# Patient Record
Sex: Male | Born: 1980
Health system: Southern US, Community
[De-identification: ages and names within clinical notes are randomized; demographics above are authoritative.]

## PROBLEM LIST (undated history)

## (undated) DIAGNOSIS — I10 Essential (primary) hypertension: Secondary | ICD-10-CM

## (undated) DIAGNOSIS — T7840XA Allergy, unspecified, initial encounter: Secondary | ICD-10-CM

## (undated) DIAGNOSIS — F329 Major depressive disorder, single episode, unspecified: Secondary | ICD-10-CM

## (undated) DIAGNOSIS — K219 Gastro-esophageal reflux disease without esophagitis: Secondary | ICD-10-CM

## (undated) DIAGNOSIS — E669 Obesity, unspecified: Secondary | ICD-10-CM

## (undated) DIAGNOSIS — R011 Cardiac murmur, unspecified: Secondary | ICD-10-CM

## (undated) DIAGNOSIS — E785 Hyperlipidemia, unspecified: Secondary | ICD-10-CM

## (undated) DIAGNOSIS — F419 Anxiety disorder, unspecified: Secondary | ICD-10-CM

## (undated) DIAGNOSIS — K922 Gastrointestinal hemorrhage, unspecified: Secondary | ICD-10-CM

## (undated) DIAGNOSIS — J45909 Unspecified asthma, uncomplicated: Secondary | ICD-10-CM

## (undated) HISTORY — DX: Major depressive disorder, single episode, unspecified: F32.9

## (undated) HISTORY — DX: Cardiac murmur, unspecified: R01.1

## (undated) HISTORY — PX: OTHER SURGICAL HISTORY: SHX169

## (undated) HISTORY — DX: Anxiety disorder, unspecified: F41.9

## (undated) HISTORY — DX: Gastro-esophageal reflux disease without esophagitis: K21.9

## (undated) HISTORY — DX: Obesity, unspecified: E66.9

## (undated) HISTORY — DX: Hyperlipidemia, unspecified: E78.5

## (undated) HISTORY — DX: Gastrointestinal hemorrhage, unspecified: K92.2

## (undated) HISTORY — DX: Unspecified asthma, uncomplicated: J45.909

## (undated) HISTORY — DX: Allergy, unspecified, initial encounter: T78.40XA

## (undated) HISTORY — DX: Essential (primary) hypertension: I10

---

## 2017-06-30 DIAGNOSIS — K648 Other hemorrhoids: Secondary | ICD-10-CM | POA: Insufficient documentation

## 2018-02-17 ENCOUNTER — Encounter: Payer: Self-pay | Admitting: Gastroenterology

## 2018-03-29 ENCOUNTER — Ambulatory Visit (INDEPENDENT_AMBULATORY_CARE_PROVIDER_SITE_OTHER): Payer: PRIVATE HEALTH INSURANCE | Admitting: Gastroenterology

## 2018-03-29 ENCOUNTER — Encounter: Payer: Self-pay | Admitting: Gastroenterology

## 2018-03-29 VITALS — BP 122/82 | HR 70 | Ht 71.0 in | Wt 310.2 lb

## 2018-03-29 DIAGNOSIS — K6289 Other specified diseases of anus and rectum: Secondary | ICD-10-CM

## 2018-03-29 DIAGNOSIS — G8929 Other chronic pain: Secondary | ICD-10-CM | POA: Diagnosis not present

## 2018-03-29 MED ORDER — HYDROCORTISONE ACE-PRAMOXINE 1.85-1.15 % RE CREA: 1.0000 "application " | TOPICAL_CREAM | Freq: Two times a day (BID) | RECTAL | 1 refills | Status: DC

## 2018-03-29 NOTE — Patient Instructions (Signed)
If you are age 35 or older, your body mass index should be between 23-30. Your Body mass index is 43.27 kg/m. If this is out of the aforementioned range listed, please consider follow up with your Primary Care Provider.  If you are age 64 or younger, your body mass index should be between 19-25. Your Body mass index is 43.27 kg/m. If this is out of the aformentioned range listed, please consider follow up with your Primary Care Provider.   We have sent the following medications to your pharmacy for you to pick up at your convenience: Procort twice daily x 2 weeks.   How to Take a Sitz Bath A sitz bath is a warm water bath that is taken while you are sitting down. The water should only come up to your hips and should cover your buttocks. Your health care provider may recommend a sitz bath to help you:  Clean the lower part of your body, including your genital area.  With itching.  With pain.  With sore muscles or muscles that tighten or spasm.  How to take a sitz bath Take 3-4 sitz baths per day or as told by your health care provider. 1. Partially fill a bathtub with warm water. You will only need the water to be deep enough to cover your hips and buttocks when you are sitting in it. 2. If your health care provider told you to put medicine in the water, follow the directions exactly. 3. Sit in the water and open the tub drain a little. 4. Turn on the warm water again to keep the tub at the correct level. Keep the water running constantly. 5. Soak in the water for 15-20 minutes or as told by your health care provider. 6. After the sitz bath, pat the affected area dry first. Do not rub it. 7. Be careful when you stand up after the sitz bath because you may feel dizzy.  Contact a health care provider if:  Your symptoms get worse. Do not continue with sitz baths if your symptoms get worse.  You have new symptoms. Do not continue with sitz baths until you talk with your health care  provider. This information is not intended to replace advice given to you by your health care provider. Make sure you discuss any questions you have with your health care provider. Document Released: 08/08/2004 Document Revised: 04/15/2016 Document Reviewed: 11/14/2014 Elsevier Interactive Patient Education  2018 [employer].   Thank you,  Dr. Jackquline Denmark

## 2018-03-29 NOTE — Progress Notes (Signed)
Chief Complaint: Rectal pain  Referring Provider: Dr. Reinaldo Meeker   ASSESSMENT AND PLAN;   Rectal pain (due to anal fissure and hoids) - Colace 1 tablet p.o. once a day to continue.  It has given him significant symptomatic relief. - procort bid x 14 days. Cards and coupons were given. - Call in 2 weeks. If still with problems, would recommend colonoscopy evaluation under propofol and a good rectal examination.  He would like to hold off on colonoscopy at the present time.  - Carry on sitz baths for now. -Good perirectal hygiene, can use unscented baby wipes, keep the perirectal area clean and dry. - FU in 12 weeks, earlier in case of any problems. - I have discussed above with the patient's wife in detail as well.  HPI:   Rectal pain x 6-7 yrs, intermittently BMs 5-6/day, after eating, mushy,  Occ constipation with hard stool with straining. Started stool sofner docusate sodium 1/day with significant relief. occ rare BRBPR. 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  Seen by surgery Dr Leanne Lovely and was scheduled for surgery but patient backed off. No fever chills night sweats, weight loss, family history of colon cancer or significant rectal drainage.  There is no family history of Crohn's disease. Never had EGD or colonoscopy performed. He does have history of lifting weights and has been lifting heavy furniture at work.  Past Medical History:  Diagnosis Date  . Anxiety   . Asthma   . Depression   . GERD (gastroesophageal reflux disease)   . GI bleed   . Hypertension   . Obesity     History reviewed. No pertinent surgical history.  Family History  Problem Relation Age of Onset  . Heart disease Father   . Diabetes Maternal Grandfather     Social History   Tobacco Use  . Smoking status: Former Smoker    Packs/day: 3.00    Years: 4.00    Pack years: 12.00    Last attempt to quit: 2003    Years since quitting: 16.3  . Smokeless tobacco: Current User    Types: Snuff    Substance Use Topics  . Alcohol use: Not Currently  . Drug use: Never    Current Outpatient Medications  Medication Sig Dispense Refill  . cetirizine (ZYRTEC) 10 MG tablet Take 10 mg by mouth daily.    Marland Kitchen docusate sodium (COLACE) 100 MG capsule Take 200 mg by mouth daily.     No current facility-administered medications for this visit.     Allergies  Allergen Reactions  . Codeine Rash  . Penicillins Rash    Review of Systems:  Constitutional: Denies fever, chills, diaphoresis, appetite change and fatigue.  HEENT: Denies photophobia, eye pain, redness, hearing loss, ear pain, congestion, sore throat, rhinorrhea, sneezing, mouth sores, neck pain, neck stiffness and tinnitus.   Respiratory: Denies SOB, DOE, cough, chest tightness,  and wheezing.   Cardiovascular: Denies chest pain, palpitations and leg swelling.  Genitourinary: Denies dysuria, urgency, frequency, hematuria, flank pain and difficulty urinating.  Musculoskeletal: Denies myalgias, back pain, joint swelling, arthralgias and gait problem.  Skin: No rash. .  Neurological: Denies dizziness, seizures, syncope, weakness, light-headedness, numbness and headaches.  Hematological: Denies adenopathy. Easy bruising, personal or family bleeding history  Psychiatric/Behavioral: Has history of anxiety or depression     Physical Exam:    BP 122/82   Pulse 70   Ht 5\' 11"  (1.803 m)   Wt (!) 310 lb 4 oz (  140.7 kg)   BMI 43.27 kg/m  Filed Weights   03/29/18 1051  Weight: (!) 310 lb 4 oz (140.7 kg)   Constitutional:  Well-developed, in no acute distress. Psychiatric: Normal mood and affect. Behavior is normal. HEENT: Pupils normal.  Conjunctivae are normal. No scleral icterus. Neck supple.  Cardiovascular: Normal rate, regular rhythm. No edema Pulmonary/chest: Effort normal and breath sounds normal. No wheezing, rales or rhonchi. Abdominal: Soft, nondistended. Nontender. Bowel sounds active throughout. There are no masses  palpable. No hepatomegaly. Rectal: Limited examination since patient was somewhat apprehensive, I could feel a posterior anal fissure but could not reach on the top of it.  Small left lateral anterior hemorrhoid without any tenderness.  Heme-negative stools.  Rectal examination was performed in presence of Heather. Neurological: Alert and oriented to person place and time. Skin: Skin is warm and dry. No rashes noted.  Data Reviewed: I have personally reviewed following labs and imaging studies  CBC: No results for input(s): WBC, NEUTROABS, HGB, HCT, MCV, PLT in the last 168 hours. Basic Metabolic Panel: No results for input(s): NA, K, CL, CO2, GLUCOSE, BUN, CREATININE, CALCIUM, MG, PHOS in the last 168 hours. GFR: CrCl cannot be calculated (No order found.). Liver Function Tests: No results for input(s): AST, ALT, ALKPHOS, BILITOT, PROT, ALBUMIN in the last 168 hours. No results for input(s): LIPASE, AMYLASE in the last 168 hours. No results for input(s): AMMONIA in the last 168 hours. Coagulation Profile: No results for input(s): INR, PROTIME in the last 168 hours. HbA1C: No results for input(s): HGBA1C in the last 72 hours. Lipid Profile: No results for input(s): CHOL, HDL, LDLCALC, TRIG, CHOLHDL, LDLDIRECT in the last 72 hours. Thyroid Function Tests: No results for input(s): TSH, T4TOTAL, FREET4, T3FREE, THYROIDAB in the last 72 hours. Anemia Panel: No results for input(s): VITAMINB12, FOLATE, FERRITIN, TIBC, IRON, RETICCTPCT in the last 72 hours.  No results found for this or any previous visit (from the past 240 hour(s)).    Radiology Studies: No results found.    Carmell Austria, MD   Cc: Dr. Reinaldo Meeker

## 2019-03-22 ENCOUNTER — Telehealth: Payer: Self-pay | Admitting: *Deleted

## 2019-03-22 ENCOUNTER — Other Ambulatory Visit: Payer: Self-pay

## 2019-03-22 ENCOUNTER — Telehealth (INDEPENDENT_AMBULATORY_CARE_PROVIDER_SITE_OTHER): Payer: PRIVATE HEALTH INSURANCE | Admitting: Gastroenterology

## 2019-03-22 ENCOUNTER — Encounter: Payer: Self-pay | Admitting: Gastroenterology

## 2019-03-22 VITALS — Ht 71.0 in | Wt 306.0 lb

## 2019-03-22 DIAGNOSIS — K6289 Other specified diseases of anus and rectum: Secondary | ICD-10-CM

## 2019-03-22 DIAGNOSIS — K625 Hemorrhage of anus and rectum: Secondary | ICD-10-CM

## 2019-03-22 MED ORDER — NA SULFATE-K SULFATE-MG SULF 17.5-3.13-1.6 GM/177ML PO SOLN: 1.0000 | Freq: Once | ORAL | 0 refills | Status: AC

## 2019-03-22 MED ORDER — FLUTICASONE PROPIONATE 0.05 % EX CREA: TOPICAL_CREAM | Freq: Two times a day (BID) | CUTANEOUS | 1 refills | Status: DC

## 2019-03-22 NOTE — Patient Instructions (Addendum)
Increase Colace 3/day.   fluticasone cream 0.05% generic 30g 1 bid PR x 10 days, 1 refill. Prescription sent to your pharmacy   Proceed with colonoscopy evaluation  With 2 day prep (suprep/miralax) under propofol and a good rectal examination.    You have been scheduled for a colonoscopy. 04/03/2019 at 8am Please follow written instructions given to you at your visit today.  Please pick up your prep supplies at the pharmacy within the next 1-3 days. ( Instructions mailed today) Suprep sent to pharmacy and the Miralax is over the counter   Good perirectal hygiene . Carry on sitz baths for now.   How to Take a [employer] A sitz bath is a warm water bath that may be used to care for your rectum, genital area, or the area between your rectum and genitals (perineum). For a sitz bath, the water only comes up to your hips and covers your buttocks. A sitz bath may done at home in a bathtub or with a portable sitz bath that fits over the toilet. Your health care provider may recommend a sitz bath to help:  Relieve pain and discomfort after delivering a baby.  Relieve pain and itching from hemorrhoids or anal fissures.  Relieve pain after certain surgeries.  Relax muscles that are sore or tight. How to take a sitz bath Take 3-4 sitz baths a day, or as many as told by your health care provider. Bathtub sitz bath To take a sitz bath in a bathtub: 1. Partially fill a bathtub with warm water. The water should be deep enough to cover your hips and buttocks when you are sitting in the tub. 2. If your health care provider told you to put medicine in the water, follow his or her instructions. 3. Sit in the water. 4. Open the tub drain a little, and leave it open during your bath. 5. Turn on the warm water again, enough to replace the water that is draining out. Keep the water running throughout your bath. This helps keep the water at the right level and the right temperature. 6. Soak in the water for  15-20 minutes, or as long as told by your health care provider. 7. When you are done, be careful when you stand up. You may feel dizzy. 8. After the sitz bath, pat yourself dry. Do not rub your skin to dry it.  Over-the-toilet sitz bath To take a sitz bath with an over-the-toilet basin: 1. Follow the manufacturer's instructions. 2. Fill the basin with warm water. 3. If your health care provider told you to put medicine in the water, follow his or her instructions. 4. Sit on the seat. Make sure the water covers your buttocks and perineum. 5. Soak in the water for 15-20 minutes, or as long as told by your health care provider. 6. After the sitz bath, pat yourself dry. Do not rub your skin to dry it. 7. Clean and dry the basin between uses. 8. Discard the basin if it cracks, or according to the manufacturer's instructions. Contact a health care provider if:  Your symptoms get worse. Do not continue with sitz baths if your symptoms get worse.  You have new symptoms. If this happens, do not continue with sitz baths until you talk with your health care provider. Summary  A sitz bath is a warm water bath in which the water only comes up to your hips and covers your buttocks.  A sitz bath may help relieve itching, relieve pain,  and relax muscles that are sore or tight in the lower part of your body, including your genital area.  Take 3-4 sitz baths a day, or as many as told by your health care provider. Soak in the water for 15-20 minutes.  Do not continue with sitz baths if your symptoms get worse. This information is not intended to replace advice given to you by your health care provider. Make sure you discuss any questions you have with your health care provider. Document Released: 08/08/2004 Document Revised: 11/18/2017 Document Reviewed: 11/18/2017 Elsevier Interactive Patient Education  2019 [place] for choosing [facility]  Dr Lyndel Safe

## 2019-03-22 NOTE — Progress Notes (Signed)
Chief Complaint: Rectal pain  Referring Provider: Dr. Lawrence Perry   ASSESSMENT AND PLAN;   #1. Rectal pain (due to anal fissure and hoids) #2. Rectal bleeding. D/d hoids, AVMs, colitis, polyps, stercoral ulcers etc, r/o colonic neoplasms or IBD.  Plan: - Increase Colace 3/day. - fluticasone cream 0.05% generic 30g 1 bid PR x 10 days, 1 refill. - Proceed with colonoscopy evaluation  With 2 day prep (suprep/miralax) under propofol and a good rectal examination.  Discussed risks and benefits.  He wishes to proceed. - Carry on sitz baths for now. - Good perirectal hygiene.  HPI:   Rectal pain x 6-7 yrs, intermittently BMs 5-6/day, after eating, mushy,  Occ constipation with hard stool with straining. Started stool sofner docusate sodium 2/day with some relief Still with problems Having more BRBPR -at times mixed with the stool. Has been using ProCort but still with problems   Seen by surgery Dr Andy Moorhead and was scheduled for surgery but patient backed off. No fever chills night sweats, weight loss, family history of colon cancer or significant rectal drainage.  There is no family history of Crohn's disease. Never had EGD or colonoscopy performed.    He does have history of lifting weights and has been lifting heavy furniture at work.  Past Medical History:  Diagnosis Date  . Anxiety   . Asthma   . Depression   . GERD (gastroesophageal reflux disease)   . GI bleed   . Hypertension   . Obesity     History reviewed. No pertinent surgical history.  Family History  Problem Relation Age of Onset  . Heart disease Father   . Diabetes Maternal Grandfather     Social History   Tobacco Use  . Smoking status: Former Smoker    Packs/day: 3.00    Years: 4.00    Pack years: 12.00    Last attempt to quit: 2003    Years since quitting: 17.3  . Smokeless tobacco: Current User    Types: Snuff  Substance Use Topics  . Alcohol use: Not Currently  . Drug use:  Never    Current Outpatient Medications  Medication Sig Dispense Refill  . cetirizine (ZYRTEC) 10 MG tablet Take 10 mg by mouth daily.    . docusate sodium (COLACE) 100 MG capsule Take 200 mg by mouth daily.    . Hydrocortisone Ace-Pramoxine (PROCORT) 1.85-1.15 % CREA Place 1 application rectally 2 (two) times daily. Please give 1 kit. 60 g 1   No current facility-administered medications for this visit.     Allergies  Allergen Reactions  . Codeine Rash  . Penicillins Rash    Review of Systems:  Constitutional: Denies fever, chills, diaphoresis, appetite change and fatigue.  neg     Physical Exam:    Ht 5' 11" (1.803 m)   Wt (!) 306 lb (138.8 kg)   BMI 42.68 kg/m  Filed Weights   03/22/19 0821  Weight: (!) 306 lb (138.8 kg)   This service was provided via telemedicine.  The patient was located at home.  The provider was located in office.  The patient did consent to this telephone visit and is aware of possible charges through their insurance for this visit.    Time spent on call and coordination of care: 25 min    Raj , MD   Cc: Dr. Lawrence Perry  

## 2019-03-22 NOTE — Telephone Encounter (Signed)
Patient chose 04/03/2019 at 8 am to proceed with his colonoscopy  Will mail out instructions today

## 2019-03-23 ENCOUNTER — Telehealth: Payer: Self-pay | Admitting: Gastroenterology

## 2019-03-23 NOTE — Telephone Encounter (Signed)
We will not be able to email a letter. I can fax it.   Instructed pt to call back with who the fax needs to be addressed to.  Pt would like to go back to work, however he still has rectal pain and bleeding. The pain is at a 8 or 9 during a BM. Stool are some what hard but the stool softener (colace) is helpful. Pt just started procort yesterday and it helps as well. Last bleeding episode was about 3-4 days ago.  Dr. Lyndel Safe, I suggested to pt to wait out the weekend. 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 Lots of physical work activity. Since you are out of the office today and it being Thursday may be the pt could start next Monday.  I informed pt that you will be in tomorrow and that will allow time for him to get fax number and for you to address this.

## 2019-03-23 NOTE — Telephone Encounter (Signed)
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 If it can be e-mail to: [email or web address]

## 2019-03-23 NOTE — Telephone Encounter (Signed)
Fax# [phone] attention to [facility].

## 2019-03-24 NOTE — Progress Notes (Signed)
Per Dr. Lyndel Safe pt may return to work 03/27/19. Work note faxed. Pt made aware.

## 2019-03-24 NOTE — Telephone Encounter (Signed)
Can go back to work on Monday Please send him a letter

## 2019-03-24 NOTE — Telephone Encounter (Signed)
Pt informed. Work note faxed.

## 2019-04-03 ENCOUNTER — Other Ambulatory Visit: Payer: Self-pay

## 2019-04-03 ENCOUNTER — Encounter: Payer: Self-pay | Admitting: Gastroenterology

## 2019-04-03 ENCOUNTER — Ambulatory Visit (AMBULATORY_SURGERY_CENTER): Payer: PRIVATE HEALTH INSURANCE | Admitting: Gastroenterology

## 2019-04-03 VITALS — BP 124/89 | HR 58 | Temp 98.3°F | Resp 17 | Ht 71.0 in | Wt 306.0 lb

## 2019-04-03 DIAGNOSIS — K6289 Other specified diseases of anus and rectum: Secondary | ICD-10-CM

## 2019-04-03 DIAGNOSIS — K621 Rectal polyp: Secondary | ICD-10-CM

## 2019-04-03 DIAGNOSIS — K648 Other hemorrhoids: Secondary | ICD-10-CM | POA: Diagnosis not present

## 2019-04-03 DIAGNOSIS — K625 Hemorrhage of anus and rectum: Secondary | ICD-10-CM

## 2019-04-03 DIAGNOSIS — D128 Benign neoplasm of rectum: Secondary | ICD-10-CM

## 2019-04-03 MED ORDER — SODIUM CHLORIDE 0.9 % IV SOLN: 500.0000 mL | Freq: Once | INTRAVENOUS | Status: DC

## 2019-04-03 NOTE — Progress Notes (Signed)
Called to room to assist during endoscopic procedure.  Patient ID and intended procedure confirmed with present staff. Received instructions for my participation in the procedure from the performing physician.  

## 2019-04-03 NOTE — Patient Instructions (Addendum)
Avoid non steroidal anti inflammatory drugs for five days, take tylenol if needed for pain. Continue Colace and Fluticasone.   YOU HAD AN ENDOSCOPIC PROCEDURE TODAY AT [facility]:   Refer to the procedure report that was given to you for any specific questions about what was found during the examination.  If the procedure report does not answer your questions, please call your gastroenterologist to clarify.  If you requested that your care partner not be given the details of your procedure findings, then the procedure report has been included in a sealed envelope for you to review at your convenience later.  YOU SHOULD EXPECT: Some feelings of bloating in the abdomen. Passage of more gas than usual.  Walking can help get rid of the air that was put into your GI tract during the procedure and reduce the bloating. If you had a lower endoscopy (such as a colonoscopy or flexible sigmoidoscopy) you may notice spotting of blood in your stool or on the toilet paper. If you underwent a bowel prep for your procedure, you may not have a normal bowel movement for a few days.  Please Note:  You might notice some irritation and congestion in your nose or some drainage.  This is from the oxygen used during your procedure.  There is no need for concern and it should clear up in a day or so.  SYMPTOMS TO REPORT IMMEDIATELY:   Following lower endoscopy (colonoscopy or flexible sigmoidoscopy):  Excessive amounts of blood in the stool  Significant tenderness or worsening of abdominal pains  Swelling of the abdomen that is new, acute  Fever of 100F or higher   For urgent or emergent issues, a gastroenterologist can be reached at any hour by calling [phone].   DIET:  We do recommend a small meal at first, but then you may proceed to your regular diet.  Drink plenty of fluids but you should avoid alcoholic beverages for 24 hours.  ACTIVITY:  You should plan to take it easy for the rest of  today and you should NOT DRIVE or use heavy machinery until tomorrow (because of the sedation medicines used during the test).    FOLLOW UP: Our staff will call the number listed on your records the next business day following your procedure to check on you and address any questions or concerns that you may have regarding the information given to you following your procedure. If we do not reach you, we will leave a message.  However, if you are feeling well and you are not experiencing any problems, there is no need to return our call.  We will assume that you have returned to your regular daily activities without incident.  If any biopsies were taken you will be contacted by phone or by letter within the next 1-3 weeks.  Please call us at [phone] if you have not heard about the biopsies in 3 weeks.    SIGNATURES/CONFIDENTIALITY: You and/or your care partner have signed paperwork which will be entered into your electronic medical record.  These signatures attest to the fact that that the information above on your After Visit Summary has been reviewed and is understood.  Full responsibility of the confidentiality of this discharge information lies with you and/or your care-partner.

## 2019-04-03 NOTE — Progress Notes (Signed)
Report given to PACU, vss 

## 2019-04-03 NOTE — Op Note (Signed)
[place] Patient Name: Mitchell Booker Procedure Date: 04/03/2019 7:59 AM MRN: [id number] Endoscopist: Jackquline Denmark , MD Age: 38 Referring MD:  Date of Birth: [birth year] Gender: Male Account #: [id number] Procedure:                Colonoscopy Indications:              Rectal bleeding Medicines:                Monitored Anesthesia Care Procedure:                Pre-Anesthesia Assessment:                           - Prior to the procedure, a History and Physical                            was performed, and patient medications and                            allergies were reviewed. The patient's tolerance of                            previous anesthesia was also reviewed. The risks                            and benefits of the procedure and the sedation                            options and risks were discussed with the patient.                            All questions were answered, and informed consent                            was obtained. Prior Anticoagulants: The patient has                            taken no previous anticoagulant or antiplatelet                            agents. ASA Grade Assessment: I - A normal, healthy                            patient. After reviewing the risks and benefits,                            the patient was deemed in satisfactory condition to                            undergo the procedure.                           After obtaining informed consent, the colonoscope  was passed under direct vision. Throughout the                            procedure, the patient's blood pressure, pulse, and                            oxygen saturations were monitored continuously. The                            Colonoscope was introduced through the anus and                            advanced to the 4 cm into the ileum. The                            colonoscopy was performed without difficulty. The        patient tolerated the procedure well. The quality                            of the bowel preparation was excellent. The                            terminal ileum, ileocecal valve, appendiceal                            orifice, and rectum were photographed. Scope In: 8:03:10 AM Scope Out: 8:13:47 AM Scope Withdrawal Time: 0 hours 8 minutes 50 seconds  Total Procedure Duration: 0 hours 10 minutes 37 seconds  Findings:                 Three sessile polyps were found in the rectum. The                            polyps were 6 to 10 mm in size. These polyps were                            removed with a hot snare. Resection and retrieval                            were complete. Estimated blood loss: none.                           Non-bleeding internal hemorrhoids were found during                            retroflexion. The hemorrhoids were small. Healed                            posterior anal fissure.                           The exam was otherwise without abnormality on  direct and retroflexion views.                           The terminal ileum appeared normal. Complications:            No immediate complications. Estimated Blood Loss:     Estimated blood loss: none. Impression:               -Rectal polyps status post polypectomy.                           -Small internal hemorrhoids.                           -Otherwise normal colonoscopy to TI. Recommendation:           - Patient has a contact number available for                            emergencies. The signs and symptoms of potential                            delayed complications were discussed with the                            patient. Return to normal activities tomorrow.                            Written discharge instructions were provided to the                            patient.                           - Resume previous diet.                           - Continue present  medications including Colace and                            Fluticasone prn.                           - Await pathology results.                           - Avoid nonsteroidals for 5 days.                           - Repeat colonoscopy for surveillance based on                            pathology results.                           - Return to GI tele-clinic in 12 weeks. Jackquline Denmark, MD 04/03/2019 8:20:16 AM This report has been signed electronically.

## 2019-04-04 ENCOUNTER — Telehealth: Payer: Self-pay

## 2019-04-04 NOTE — Telephone Encounter (Signed)
  Follow up Call-  Call back number 04/03/2019  Post procedure Call Back phone  # [phone]  Permission to leave phone message Yes     Patient questions:  Do you have a fever, pain , or abdominal swelling? No. Pain Score  0 *  Have you tolerated food without any problems? Yes.    Have you been able to return to your normal activities? Yes.    Do you have any questions about your discharge instructions: Diet   No. Medications  No. Follow up visit  No.  Do you have questions or concerns about your Care? No.  Actions: * If pain score is 4 or above:0

## 2019-04-04 NOTE — Telephone Encounter (Signed)
Left message on follow up call. 

## 2019-04-06 ENCOUNTER — Encounter: Payer: Self-pay | Admitting: Gastroenterology

## 2019-04-12 ENCOUNTER — Telehealth: Payer: Self-pay | Admitting: *Deleted

## 2019-04-12 NOTE — Telephone Encounter (Signed)
1. Have you developed a fever since your procedure? No.  2.   Have you had an respiratory symptoms (SOB or cough) since your procedure? No.  3.   Have you tested positive for COVID 19 since your procedure No.  3.   Have you had any family members/close contacts diagnosed with the COVID 19 since your procedure?  No   If any of these questions are a yes, please inquire if patient has been seen by family doctor and route this note to Jackie Littlejohn Walton, RN. 

## 2019-04-28 ENCOUNTER — Telehealth: Payer: Self-pay | Admitting: Gastroenterology

## 2019-04-28 NOTE — Telephone Encounter (Signed)
Pt called and states he is in as much pain as he was prior to having colonoscopy done. He states he is using hemorrhoid creams and took some stool softeners. He was just leaving urgent care when I called him. Asked pt what they did for him and he states they wrote him a script but he cannot read the writing but he thinks it is for some pain medication. Pt wants the pain gone. Let him know this RN would notify the doctor and let him know the recommendation. Please advise.

## 2019-04-28 NOTE — Telephone Encounter (Signed)
Urgent care must have done a rectal exam Not sure which cream the prescribed. He should use that and let us know He still has problems next week, will give him Cardizem or nitroglycerin cream.  If he still has problems, will consider further evaluation. Please tell him to call us next week to let us know how he is doing even if he is better. Thanks [employer]

## 2019-04-28 NOTE — Telephone Encounter (Signed)
Spoke with pt and he is aware. 

## 2019-04-28 NOTE — Telephone Encounter (Signed)
Pt called stating that he has been experiencing rectal pain today and is excrutiating. He states to have been feeling fine after his procedure on 04/03/19 but today for some reason he is in pain. He would like some advise.

## 2019-04-29 ENCOUNTER — Telehealth: Payer: Self-pay | Admitting: Internal Medicine

## 2019-04-29 NOTE — Telephone Encounter (Signed)
Chart reviewed. Wife called for patient with severe rectal pain after a hard BM. Classic for fissure. Recommended keeping bowel soft (mirilax etc) and using tramadol, given by urgent care, for pain. Also, sitz bath bid. Told to call office Monday if needed. May benefit from 2% diltiazem cream from specialty pharmacy.

## 2019-05-01 ENCOUNTER — Telehealth: Payer: Self-pay | Admitting: Gastroenterology

## 2019-05-01 ENCOUNTER — Other Ambulatory Visit: Payer: Self-pay

## 2019-05-01 MED ORDER — DILTIAZEM GEL 2 %: CUTANEOUS | 1 refills | Status: DC

## 2019-05-01 NOTE — Telephone Encounter (Signed)
Pt was seen in the ED 04/29/19.  Pt reported severe anal pain, possible anal fissure and requested a call to discuss.

## 2019-05-01 NOTE — Telephone Encounter (Signed)
Pt did not have a good weekend. He spoke with oncall md and was seen in the ER. Pt was instructed to keep his BM's soft and told to take miralax 1 capful BID and colace BID. Wife wants to know if this is the bowel regimen Dr. Lyndel Safe feels he needs to do to prevent this type of pain again. On call md also mentioned to them possibly adding diltiazem or nitro ointment. Please advise.

## 2019-05-01 NOTE — Telephone Encounter (Signed)
Spoke with pt and he is aware, script sent to pharmacy. 

## 2019-05-01 NOTE — Telephone Encounter (Signed)
Cardizem 0.2% ointment PR QID x 1 week, then BID [facility] pharmacy to make (I believe [facility] or [facility] in [place] can make it) 2 refills  Let us know how he is in 1 week.

## 2019-05-04 ENCOUNTER — Encounter: Payer: Self-pay | Admitting: Gastroenterology

## 2019-05-04 ENCOUNTER — Ambulatory Visit (INDEPENDENT_AMBULATORY_CARE_PROVIDER_SITE_OTHER): Payer: PRIVATE HEALTH INSURANCE | Admitting: Gastroenterology

## 2019-05-04 ENCOUNTER — Other Ambulatory Visit: Payer: Self-pay

## 2019-05-04 VITALS — Ht 71.0 in | Wt 300.0 lb

## 2019-05-04 DIAGNOSIS — K6289 Other specified diseases of anus and rectum: Secondary | ICD-10-CM

## 2019-05-04 NOTE — Progress Notes (Signed)
Chief Complaint: FU Rectal pain  Referring Provider: Dr. Reinaldo Meeker   ASSESSMENT AND PLAN;   #1. Rectal pain (due to anal fissure). Neg colonoscopy 04/03/2019 except for small hyperplastic rectal polyps, small internal hemorrhoids.  Otherwise normal to TI.  No Crohn's. Neg CT pelvis with contrast 04/30/2019. Didn't fill topical Cardizem prescription.  Plan: -Colace 1 tablet p.o. twice daily to continue. -MiraLAX 17 g p.o. twice daily to continue. -Continue fluticasone cream 0.05% generic 30g 1 bid PR x 10 days, 1 refill. -Continue nitroglycerin 0.4% twice daily x 4 weeks. -Carry on sitz baths PRN for now. -Good perirectal hygiene, can use unscented baby wipes, keep the perirectal area clean and dry. -He is to call us if he has any problems in the future.  If he has recurrent problems, and fails medical treatment, would recommend surgical evaluation (Dr Coralie Keens) for possible EUA/lateral sphincterotomy if needed.  Certainly, that will be the last resort.  HPI:   38 year old very pleasant male  With intermittent rectal pain x 6-7 yrs.  Had some constipation which is currently better with stool softeners and MiraLAX. At the present time, denies having any problems.  Feels 100% better. He was at work.  Had negative colonoscopy 04/03/2019.  Was seen in ED on May 01, 2019 with acute rectal pain.  Had to be given Dilaudid.  He was thereafter started on nitroglycerin 0.4% twice daily and carried on with fluticasone.  Had mildly elevated WBC count 12 point 5K with normal hemoglobin.  Underwent CT scan pelvis with contrast which was negative for perianal fistula or abscess.  Seen by surgery Dr Leanne Lovely and was scheduled for surgery but patient backed off several months ago.  No fever chills night sweats, weight loss, family history of colon cancer or significant rectal drainage.  There is no family history of Crohn's disease.   He does have history of lifting weights and has been  lifting heavy furniture at work.  Past Medical History:  Diagnosis Date  . Allergy    seasonal  . Anxiety   . Asthma   . Depression   . GERD (gastroesophageal reflux disease)   . GI bleed   . Heart murmur   . Hyperlipidemia   . Hypertension   . Obesity     Past Surgical History:  Procedure Laterality Date  . wisdom teeth      Family History  Problem Relation Age of Onset  . Heart disease Father   . Diabetes Maternal Grandfather   . Colon cancer Neg Hx   . Colon polyps Neg Hx   . Esophageal cancer Neg Hx   . Prostate cancer Neg Hx   . Rectal cancer Neg Hx     Social History   Tobacco Use  . Smoking status: Former Smoker    Packs/day: 3.00    Years: 4.00    Pack years: 12.00    Last attempt to quit: 2003    Years since quitting: 17.4  . Smokeless tobacco: Current User    Types: Snuff  Substance Use Topics  . Alcohol use: Not Currently  . Drug use: Never    Current Outpatient Medications  Medication Sig Dispense Refill  . cetirizine (ZYRTEC) 10 MG tablet Take 10 mg by mouth daily.    Marland Kitchen docusate sodium (COLACE) 100 MG capsule Take 200 mg by mouth daily.    . fluticasone (CUTIVATE) 0.05 % cream Apply topically 2 (two) times daily. Per rectum for 10 days 30 g  1  . polyethylene glycol (MIRALAX / GLYCOLAX) 17 g packet Take 34 g by mouth daily.    . Fluticasone Furoate-Vilanterol (BREO ELLIPTA IN) Inhale into the lungs as needed.     No current facility-administered medications for this visit.     Allergies  Allergen Reactions  . Codeine Rash  . Penicillins Rash    Review of Systems:  neg     Physical Exam:    Ht 5\' 11"  (1.803 m)   Wt 300 lb (136.1 kg)   BMI 41.84 kg/m  Filed Weights   05/04/19 0847  Weight: 300 lb (136.1 kg)  televisit  This service was provided via telemedicine.  The patient was located at work.  The provider was located in office.  The patient did consent to this telephone visit and is aware of possible charges through their  insurance for this visit.  Time spent on call: 15 min     Carmell Austria, MD   Cc: Dr. Reinaldo Meeker

## 2019-05-04 NOTE — Patient Instructions (Signed)
If you are age 36 or older, your body mass index should be between 23-30. Your Body mass index is 41.84 kg/m. If this is out of the aforementioned range listed, please consider follow up with your Primary Care Provider.  If you are age 39 or younger, your body mass index should be between 19-25. Your Body mass index is 41.84 kg/m. If this is out of the aformentioned range listed, please consider follow up with your Primary Care Provider.   To help prevent the possible spread of infection to our patients, communities, and staff; we will be implementing the following measures:  As of now we are not allowing any visitors/family members to accompany you to any upcoming appointments with [facility]. If you have any concerns about this please contact our office to discuss prior to the appointment.   Continue using Colace 1 tablet twice daily, Miralax 17g by mouth twice daily, Fluticasone cream 0.05% twice daily for 10 days, nitroglycerin 0.4% twice daily for 4 weeks, and sitz baths.  Use unscented baby wipes after bowel movements.  Thank you,  Dr. Jackquline Denmark

## 2019-10-10 DIAGNOSIS — E782 Mixed hyperlipidemia: Secondary | ICD-10-CM | POA: Diagnosis not present

## 2019-10-10 DIAGNOSIS — F322 Major depressive disorder, single episode, severe without psychotic features: Secondary | ICD-10-CM | POA: Diagnosis not present

## 2019-10-10 DIAGNOSIS — I10 Essential (primary) hypertension: Secondary | ICD-10-CM | POA: Diagnosis not present

## 2019-10-10 DIAGNOSIS — J4520 Mild intermittent asthma, uncomplicated: Secondary | ICD-10-CM | POA: Diagnosis not present

## 2020-02-20 ENCOUNTER — Encounter: Payer: Self-pay | Admitting: Legal Medicine

## 2020-02-20 ENCOUNTER — Ambulatory Visit: Payer: BC Managed Care – PPO | Admitting: Legal Medicine

## 2020-02-20 ENCOUNTER — Other Ambulatory Visit: Payer: Self-pay

## 2020-02-20 VITALS — BP 144/88 | HR 72 | Temp 98.2°F | Ht 71.0 in | Wt 312.4 lb

## 2020-02-20 DIAGNOSIS — I10 Essential (primary) hypertension: Secondary | ICD-10-CM | POA: Diagnosis not present

## 2020-02-20 DIAGNOSIS — F3132 Bipolar disorder, current episode depressed, moderate: Secondary | ICD-10-CM | POA: Insufficient documentation

## 2020-02-20 DIAGNOSIS — F322 Major depressive disorder, single episode, severe without psychotic features: Secondary | ICD-10-CM

## 2020-02-20 DIAGNOSIS — E782 Mixed hyperlipidemia: Secondary | ICD-10-CM | POA: Diagnosis not present

## 2020-02-20 DIAGNOSIS — E291 Testicular hypofunction: Secondary | ICD-10-CM

## 2020-02-20 DIAGNOSIS — F3131 Bipolar disorder, current episode depressed, mild: Secondary | ICD-10-CM | POA: Insufficient documentation

## 2020-02-20 DIAGNOSIS — N529 Male erectile dysfunction, unspecified: Secondary | ICD-10-CM

## 2020-02-20 HISTORY — DX: Essential (primary) hypertension: I10

## 2020-02-20 HISTORY — DX: Mixed hyperlipidemia: E78.2

## 2020-02-20 MED ORDER — SERTRALINE HCL 50 MG PO TABS: 50.0000 mg | ORAL_TABLET | Freq: Every day | ORAL | 2 refills | Status: DC

## 2020-02-20 NOTE — Assessment & Plan Note (Signed)
Patient's depression is uncontrolled with sertraline.   Anhedonia worse.  PHQ  Performed. score 8. An individual care plan was established or reinforced today.  The patient's disease status was assessed using clinical findings on exam, labs, and or other diagnostic testing to determine patient's success in meeting treatment goals based on disease specific evidence-based guidelines and found to be worsening Recommendations include increase sertraline dose.  Follow up 2 weeks

## 2020-02-20 NOTE — Progress Notes (Signed)
Established Patient Office Visit  Subjective:  Patient ID: Mitchell Booker, male    DOB: Apr 15, 1981  Age: 39 y.o. MRN: [id number]  CC:  Chief Complaint  Patient presents with  . Hyperlipidemia  . Hypertension  . Depression    HPI Mitchell Booker presents for chronic visit.  Patient presents with hyperlipidemia.  Compliance with treatment has been good; patient takes medicines as directed, maintains low cholesterol diet, follows up as directed, and maintains exercise regimen.  Patient is using none without problems.  Patient presents for follow up of hypertension.  Patient tolerating no medicines well with side effects.  Patient was diagnosed with hypertension 2010 so has been treated for hypertension for 10 years.Patient is working on maintaining diet and exercise regimen and follows up as directed. Complication include none.  This patient has major depression for 6 months.  PHQ9 =8.  Patient is having less anhedonia.  The patient has less future plans and prospects.  The depression is worse with stress.  The patient is not exercising and working on behavior to improve mental health.  Patient is not seeing a therapist or psychiatrist.  none  Patient is on sertraline..  Past Medical History:  Diagnosis Date  . Allergy    seasonal  . Anxiety   . Asthma   . Depression   . Essential hypertension 02/20/2020  . GERD (gastroesophageal reflux disease)   . GI bleed   . Heart murmur   . Hyperlipidemia   . Hypertension   . Mixed hyperlipidemia 02/20/2020  . Obesity     Past Surgical History:  Procedure Laterality Date  . wisdom teeth      Family History  Problem Relation Age of Onset  . Heart disease Father   . Diabetes Maternal Grandfather   . Colon cancer Neg Hx   . Colon polyps Neg Hx   . Esophageal cancer Neg Hx   . Prostate cancer Neg Hx   . Rectal cancer Neg Hx     Social History   Socioeconomic History  . Marital status: Married    Spouse name: Not on file  .  Number of children: 1  . Years of education: Not on file  . Highest education level: Not on file  Occupational History    Employer: [employer]  Tobacco Use  . Smoking status: Former Smoker    Packs/day: 3.00    Years: 4.00    Pack years: 12.00    Quit date: 2003    Years since quitting: 18.2  . Smokeless tobacco: Current User    Types: Snuff  Substance and Sexual Activity  . Alcohol use: Not Currently  . Drug use: Never  . Sexual activity: Not on file  Other Topics Concern  . Not on file  Social History Narrative  . Not on file   Social Determinants of Health   Financial Resource Strain:   . Difficulty of Paying Living Expenses:   Food Insecurity:   . Worried About Charity fundraiser in the Last Year:   . Arboriculturist in the Last Year:   Transportation Needs:   . Film/video editor (Medical):   Marland Kitchen Lack of Transportation (Non-Medical):   Physical Activity:   . Days of Exercise per Week:   . Minutes of Exercise per Session:   Stress:   . Feeling of Stress :   Social Connections:   . Frequency of Communication with Friends and Family:   . Frequency of Social  Gatherings with Friends and Family:   . Attends Religious Services:   . Active Member of Clubs or Organizations:   . Attends Archivist Meetings:   Marland Kitchen Marital Status:   Intimate Partner Violence:   . Fear of Current or Ex-Partner:   . Emotionally Abused:   Marland Kitchen Physically Abused:   . Sexually Abused:     Outpatient Medications Prior to Visit  Medication Sig Dispense Refill  . cetirizine (ZYRTEC) 10 MG tablet Take 10 mg by mouth daily.    Marland Kitchen docusate sodium (COLACE) 100 MG capsule Take 200 mg by mouth daily.    . polyethylene glycol (MIRALAX / GLYCOLAX) 17 g packet Take 34 g by mouth daily.    . sertraline (ZOLOFT) 25 MG tablet Take 25 mg by mouth daily.    . sertraline (ZOLOFT) 50 MG tablet Take 50 mg by mouth daily.    . fluticasone (CUTIVATE) 0.05 % cream Apply topically 2 (two) times  daily. Per rectum for 10 days 30 g 1  . Fluticasone Furoate-Vilanterol (BREO ELLIPTA IN) Inhale into the lungs as needed.     No facility-administered medications prior to visit.    Allergies  Allergen Reactions  . Codeine Rash  . Penicillins Rash    ROS Review of Systems  Constitutional: Negative.   HENT: Negative.   Eyes: Negative.   Respiratory: Negative.   Gastrointestinal: Negative.   Endocrine: Negative.   Genitourinary: Negative.        Ed  Musculoskeletal: Negative.   Neurological: Negative.   Psychiatric/Behavioral: Positive for dysphoric mood.      Objective:    Physical Exam  Constitutional: He is oriented to person, place, and time. He appears well-developed and well-nourished.  HENT:  Head: Normocephalic and atraumatic.  Eyes: Pupils are equal, round, and reactive to light. Conjunctivae and EOM are normal.  Cardiovascular: Normal rate, regular rhythm and normal heart sounds.  Pulmonary/Chest: Effort normal and breath sounds normal.  Abdominal: Soft. Bowel sounds are normal.  Musculoskeletal:        General: Normal range of motion.     Cervical back: Normal range of motion and neck supple.  Neurological: He is alert and oriented to person, place, and time. He has normal reflexes.  Skin: Skin is warm.  Psychiatric:  depressed  Vitals reviewed.   BP (!) 144/88   Pulse 72   Temp 98.2 F (36.8 C)   Ht _0  (1.803 m)   Wt (!) 312 lb 6.4 oz (141.7 kg)   SpO2 98%   BMI 43.57 kg/m  Wt Readings from Last 3 Encounters:  02/20/20 (!) 312 lb 6.4 oz (141.7 kg)  05/04/19 300 lb (136.1 kg)  04/03/19 (!) 306 lb (138.8 kg)     Health Maintenance Due  Topic Date Due  . HIV Screening  Never done  . INFLUENZA VACCINE  Never done    There are no preventive care reminders to display for this patient.  No results found for: TSH No results found for: WBC, HGB, HCT, MCV, PLT No results found for: NA, K, CHLORIDE, CO2, GLUCOSE, BUN, CREATININE, BILITOT,  ALKPHOS, AST, ALT, PROT, ALBUMIN, CALCIUM, ANIONGAP, EGFR, GFR No results found for: CHOL No results found for: HDL No results found for: LDLCALC No results found for: TRIG No results found for: CHOLHDL No results found for: HGBA1C    Assessment & Plan:   Problem List Items Addressed This Visit      Cardiovascular and Mediastinum   Essential  hypertension    An individual care plan was established and reinforced today.  The patient's status was assessed using clinical findings on exam and labs or diagnostic tests. The patient's success at meeting treatment goals on disease specific evidence-based guidelines and found to be well controlled. SELF MANAGEMENT: The patient and I together assessed ways to personally work towards obtaining the recommended goals. RECOMMENDATIONS: avoid decongestants found in common cold remedies, decrease consumption of alcohol, perform routine monitoring of BP with home BP cuff, exercise, reduction of dietary salt, take medicines as prescribed, try not to miss doses and quit smoking.  Regular exercise and maintaining a healthy weight is needed.  Stress reduction may help. A CLINICAL SUMMARY including written plan identify barriers to care unique to individual due to social or financial issues.  We attempt to mutually creat solutions for individual and family understanding.      Relevant Orders   CBC with Differential   Comprehensive metabolic panel     Other   Mixed hyperlipidemia    AN INDIVIDUAL CARE PLAN was established and reinforced today.  The patient's status was assessed using clinical findings on exam, lab and other diagnostic tests. The patient's disease status was assessed based on evidence-based guidelines and found to be well controlled. MEDICATIONS were reviewed. SELF MANAGEMENT GOALS have been discussed and patient's success at attaining the goal of low cholesterol was assessed. RECOMMENDATION given include regular exercise 3 days a week and low  cholesterol/low fat diet. CLINICAL SUMMARY including written plan to identify barriers unique to the patient due to social or economic  reasons was discussed.      Relevant Orders   Lipid Panel   Major depressive disorder, single episode, severe without psychotic features ([place])    Patient's depression is uncontrolled with sertraline.   Anhedonia worse.  PHQ  Performed. score 8. An individual care plan was established or reinforced today.  The patient's disease status was assessed using clinical findings on exam, labs, and or other diagnostic testing to determine patient's success in meeting treatment goals based on disease specific evidence-based guidelines and found to be worsening Recommendations include increase sertraline dose.  Follow up 2 weeks      Relevant Medications   sertraline (ZOLOFT) 50 MG tablet   ED (erectile dysfunction)    Patient is having ED we will check testosterone level       Other Visit Diagnoses    Hypogonadism in male    -  Primary   Relevant Orders   Testosterone      Meds ordered this encounter  Medications  . sertraline (ZOLOFT) 50 MG tablet    Sig: Take 1 tablet (50 mg total) by mouth daily.    Dispense:  30 tablet    Refill:  2    Follow-up: Return in about 2 weeks (around 03/05/2020) for fasting.    Reinaldo Meeker, MD

## 2020-02-20 NOTE — Assessment & Plan Note (Signed)

## 2020-02-20 NOTE — Assessment & Plan Note (Signed)

## 2020-02-20 NOTE — Assessment & Plan Note (Signed)
Patient is having ED we will check testosterone level

## 2020-02-21 LAB — LIPID PANEL
Chol/HDL Ratio: 4.3 ratio (ref 0.0–5.0)
Cholesterol, Total: 166 mg/dL (ref 100–199)
HDL: 39 mg/dL — ABNORMAL LOW (ref >39)
LDL Chol Calc (NIH): 92 mg/dL (ref 0–99)
Triglycerides: 206 mg/dL — ABNORMAL HIGH (ref 0–149)
VLDL Cholesterol Cal: 35 mg/dL (ref 5–40)

## 2020-02-21 LAB — CBC WITH DIFFERENTIAL/PLATELET
Basophils Absolute: 0.1 10*3/uL (ref 0.0–0.2)
Basos: 1 %
EOS (ABSOLUTE): 0.1 10*3/uL (ref 0.0–0.4)
Eos: 1 %
Hematocrit: 45.7 % (ref 37.5–51.0)
Hemoglobin: 15.9 g/dL (ref 13.0–17.7)
Immature Grans (Abs): 0 10*3/uL (ref 0.0–0.1)
Immature Granulocytes: 0 %
Lymphocytes Absolute: 2.8 10*3/uL (ref 0.7–3.1)
Lymphs: 27 %
MCH: 29.7 pg (ref 26.6–33.0)
MCHC: 34.8 g/dL (ref 31.5–35.7)
MCV: 85 fL (ref 79–97)
Monocytes Absolute: 0.6 10*3/uL (ref 0.1–0.9)
Monocytes: 6 %
Neutrophils Absolute: 6.8 10*3/uL (ref 1.4–7.0)
Neutrophils: 65 %
Platelets: 275 10*3/uL (ref 150–450)
RBC: 5.35 x10E6/uL (ref 4.14–5.80)
RDW: 12.8 % (ref 11.6–15.4)
WBC: 10.4 10*3/uL (ref 3.4–10.8)

## 2020-02-21 LAB — COMPREHENSIVE METABOLIC PANEL
ALT: 35 IU/L (ref 0–44)
AST: 32 IU/L (ref 0–40)
Albumin/Globulin Ratio: 1.8 (ref 1.2–2.2)
Albumin: 4.4 g/dL (ref 4.0–5.0)
Alkaline Phosphatase: 95 IU/L (ref 39–117)
BUN/Creatinine Ratio: 9 (ref 9–20)
BUN: 11 mg/dL (ref 6–20)
Bilirubin Total: 0.4 mg/dL (ref 0.0–1.2)
CO2: 21 mmol/L (ref 20–29)
Calcium: 9.5 mg/dL (ref 8.7–10.2)
Chloride: 101 mmol/L (ref 96–106)
Creatinine, Ser: 1.17 mg/dL (ref 0.76–1.27)
GFR calc Af Amer: 92 mL/min/{1.73_m2} (ref >59)
GFR calc non Af Amer: 80 mL/min/{1.73_m2} (ref >59)
Globulin, Total: 2.5 g/dL (ref 1.5–4.5)
Glucose: 96 mg/dL (ref 65–99)
Potassium: 3.8 mmol/L (ref 3.5–5.2)
Sodium: 140 mmol/L (ref 134–144)
Total Protein: 6.9 g/dL (ref 6.0–8.5)

## 2020-02-21 LAB — CARDIOVASCULAR RISK ASSESSMENT

## 2020-02-21 LAB — TESTOSTERONE: Testosterone: 405 ng/dL (ref 264–916)

## 2020-02-21 NOTE — Progress Notes (Signed)
Cbc normal, no anemia, kidney and liver tests normal, potassium and sugar normal, testosterone 4.5- in normal range, Triglycerides high- stay on low cholesterol diet lp

## 2020-02-28 ENCOUNTER — Other Ambulatory Visit: Payer: Self-pay | Admitting: Legal Medicine

## 2020-03-05 ENCOUNTER — Encounter: Payer: Self-pay | Admitting: Legal Medicine

## 2020-03-05 ENCOUNTER — Ambulatory Visit: Payer: BC Managed Care – PPO | Admitting: Legal Medicine

## 2020-03-05 ENCOUNTER — Other Ambulatory Visit: Payer: Self-pay

## 2020-03-05 VITALS — BP 128/98 | HR 80 | Temp 96.9°F | Resp 18 | Ht 71.0 in | Wt 313.0 lb

## 2020-03-05 DIAGNOSIS — I10 Essential (primary) hypertension: Secondary | ICD-10-CM

## 2020-03-05 DIAGNOSIS — F322 Major depressive disorder, single episode, severe without psychotic features: Secondary | ICD-10-CM | POA: Diagnosis not present

## 2020-03-05 NOTE — Assessment & Plan Note (Signed)
An individualize plan was formulated using patient history and physical exam to encourage weight loss.  An evidence based program was formulated.  Patient is to cut portion sizes and increase exercise to 150 minutes per week with meals and to plan physical exercise 3 days a week at least 20 minutes.  Weight watchers and other programs are helpful.  Planned amount of weight loss 40 lbs.

## 2020-03-05 NOTE — Assessment & Plan Note (Signed)
Patient's depression is uncontrolled with zoloft 50mg .   Anhedonia better.  PHQ 9 was performed score 14. An individual care plan was established or reinforced today.  The patient's disease status was assessed using clinical findings on exam, labs, and or other diagnostic testing to determine patient's success in meeting treatment goals based on disease specific evidence-based guidelines and found to be some improvement Recommendations include increase zoloft to 100mg  and follow up in one month

## 2020-03-05 NOTE — Assessment & Plan Note (Signed)

## 2020-03-05 NOTE — Progress Notes (Signed)
Established Patient Office Visit  Subjective:  Patient ID: Mitchell Booker, male    DOB: December 14, 1980  Age: 39 y.o. MRN: [id number]  CC:  Chief Complaint  Patient presents with  . Depression    Patient started zolof 2 weeks ago    HPI Mitchell Booker presents for follow up.  Patient presents for follow up of hypertension.  Patient tolerating diet well with side effects.  Patient was diagnosed with hypertension 2010 so has been treated for hypertension for 10 years.Patient is working on maintaining diet and exercise regimen and follows up as directed. Complication include none.  This patient has major depression for 6 month.  PHQ9 =13.  Patient is having less anhedonia.  The patient has improve future plans and prospects.  The depression is worse with stress.  The patient is not exercising and working on behavior to improve mental health.  Patient is not seeing a therapist or psychiatrist.  na  Patient is on zoloft 50mg . Normal testosterone.  Past Medical History:  Diagnosis Date  . Allergy    seasonal  . Anxiety   . Asthma   . Depression   . Essential hypertension 02/20/2020  . GERD (gastroesophageal reflux disease)   . GI bleed   . Heart murmur   . Hyperlipidemia   . Hypertension   . Mixed hyperlipidemia 02/20/2020  . Obesity     Past Surgical History:  Procedure Laterality Date  . wisdom teeth      Family History  Problem Relation Age of Onset  . Heart disease Father   . Diabetes Maternal Grandfather   . Colon cancer Neg Hx   . Colon polyps Neg Hx   . Esophageal cancer Neg Hx   . Prostate cancer Neg Hx   . Rectal cancer Neg Hx     Social History   Socioeconomic History  . Marital status: Married    Spouse name: Not on file  . Number of children: 1  . Years of education: Not on file  . Highest education level: Not on file  Occupational History    Employer: [employer]  Tobacco Use  . Smoking status: Former Smoker    Packs/day: 3.00    Years: 4.00   Pack years: 12.00    Quit date: 2003    Years since quitting: 18.2  . Smokeless tobacco: Current User    Types: Chew  Substance and Sexual Activity  . Alcohol use: Not Currently  . Drug use: Never  . Sexual activity: Yes    Partners: Female  Other Topics Concern  . Not on file  Social History Narrative  . Not on file   Social Determinants of Health   Financial Resource Strain:   . Difficulty of Paying Living Expenses:   Food Insecurity:   . Worried About Charity fundraiser in the Last Year:   . Arboriculturist in the Last Year:   Transportation Needs:   . Film/video editor (Medical):   Marland Kitchen Lack of Transportation (Non-Medical):   Physical Activity:   . Days of Exercise per Week:   . Minutes of Exercise per Session:   Stress:   . Feeling of Stress :   Social Connections:   . Frequency of Communication with Friends and Family:   . Frequency of Social Gatherings with Friends and Family:   . Attends Religious Services:   . Active Member of Clubs or Organizations:   . Attends Archivist Meetings:   .  Marital Status:   Intimate Partner Violence:   . Fear of Current or Ex-Partner:   . Emotionally Abused:   Marland Kitchen Physically Abused:   . Sexually Abused:     Outpatient Medications Prior to Visit  Medication Sig Dispense Refill  . cetirizine (ZYRTEC) 10 MG tablet Take 10 mg by mouth daily.    Marland Kitchen docusate sodium (COLACE) 100 MG capsule Take 200 mg by mouth daily.    . polyethylene glycol (MIRALAX / GLYCOLAX) 17 g packet Take 34 g by mouth daily.    . sertraline (ZOLOFT) 50 MG tablet Take 1 tablet (50 mg total) by mouth daily. 30 tablet 2   No facility-administered medications prior to visit.    Allergies  Allergen Reactions  . Codeine Rash  . Penicillins Rash    ROS Review of Systems  Constitutional: Negative.   HENT: Negative.   Eyes: Negative.   Respiratory: Negative.   Cardiovascular: Negative.   Endocrine: Negative.   Musculoskeletal: Negative.     Skin: Negative.   Neurological: Negative.   Psychiatric/Behavioral: Positive for dysphoric mood.      Objective:    Physical Exam  Constitutional: He is oriented to person, place, and time. He appears well-developed and well-nourished.  HENT:  Head: Normocephalic and atraumatic.  Eyes: Pupils are equal, round, and reactive to light. Conjunctivae and EOM are normal.  Cardiovascular: Normal rate, regular rhythm and normal heart sounds.  Pulmonary/Chest: Effort normal and breath sounds normal.  Abdominal: Soft. Bowel sounds are normal.  Musculoskeletal:        General: Normal range of motion.     Cervical back: Normal range of motion and neck supple.  Neurological: He is alert and oriented to person, place, and time. He has normal reflexes.  Skin: Skin is warm.  Psychiatric:  depressed  Vitals reviewed.   BP (!) 128/98 (BP Location: Right Arm, Patient Position: Sitting)   Pulse 80   Temp (!) 96.9 F (36.1 C) (Temporal)   Resp 18   Ht 5\' 11"  (1.803 m)   Wt (!) 313 lb (142 kg)   BMI 43.65 kg/m  Wt Readings from Last 3 Encounters:  03/05/20 (!) 313 lb (142 kg)  02/20/20 (!) 312 lb 6.4 oz (141.7 kg)  05/04/19 300 lb (136.1 kg)     Health Maintenance Due  Topic Date Due  . HIV Screening  Never done    There are no preventive care reminders to display for this patient.  No results found for: TSH Lab Results  Component Value Date   WBC 10.4 02/20/2020   HGB 15.9 02/20/2020   HCT 45.7 02/20/2020   MCV 85 02/20/2020   PLT 275 02/20/2020   Lab Results  Component Value Date   NA 140 02/20/2020   K 3.8 02/20/2020   CO2 21 02/20/2020   GLUCOSE 96 02/20/2020   BUN 11 02/20/2020   CREATININE 1.17 02/20/2020   BILITOT 0.4 02/20/2020   ALKPHOS 95 02/20/2020   AST 32 02/20/2020   ALT 35 02/20/2020   PROT 6.9 02/20/2020   ALBUMIN 4.4 02/20/2020   CALCIUM 9.5 02/20/2020   Lab Results  Component Value Date   CHOL 166 02/20/2020   Lab Results  Component Value  Date   HDL 39 (L) 02/20/2020   Lab Results  Component Value Date   LDLCALC 92 02/20/2020   Lab Results  Component Value Date   TRIG 206 (H) 02/20/2020   Lab Results  Component Value Date   CHOLHDL  4.3 02/20/2020   No results found for: HGBA1C    Assessment & Plan:   Problem List Items Addressed This Visit      Cardiovascular and Mediastinum   Essential hypertension - Primary    An individual hypertension care plan was established and reinforced today.  The patient's status was assessed using clinical findings on exam and labs or diagnostic tests. The patient's success at meeting treatment goals on disease specific evidence-based guidelines and found to be well controlled. SELF MANAGEMENT: The patient and I together assessed ways to personally work towards obtaining the recommended goals. RECOMMENDATIONS: avoid decongestants found in common cold remedies, decrease consumption of alcohol, perform routine monitoring of BP with home BP cuff, exercise, reduction of dietary salt, take medicines as prescribed, try not to miss doses and quit smoking.  Regular exercise and maintaining a healthy weight is needed.  Stress reduction may help. A CLINICAL SUMMARY including written plan identify barriers to care unique to individual due to social or financial issues.  We attempt to mutually creat solutions for individual and family understanding.        Other   Major depressive disorder, single episode, severe without psychotic features ([place])    Patient's depression is uncontrolled with zoloft 50mg .   Anhedonia better.  PHQ 9 was performed score 14. An individual care plan was established or reinforced today.  The patient's disease status was assessed using clinical findings on exam, labs, and or other diagnostic testing to determine patient's success in meeting treatment goals based on disease specific evidence-based guidelines and found to be some improvement Recommendations include increase  zoloft to 100mg  and follow up in one month      Morbid obesity ([place])    An individualize plan was formulated using patient history and physical exam to encourage weight loss.  An evidence based program was formulated.  Patient is to cut portion sizes and increase exercise to 150 minutes per week with meals and to plan physical exercise 3 days a week at least 20 minutes.  Weight watchers and other programs are helpful.  Planned amount of weight loss 40 lbs.         No orders of the defined types were placed in this encounter.   Follow-up: Return in about 1 month (around 04/04/2020).    Reinaldo Meeker, MD

## 2020-03-11 ENCOUNTER — Telehealth: Payer: Self-pay

## 2020-03-11 NOTE — Telephone Encounter (Signed)
Patient is takin zoloft for depression, he stated the depression is better, but his libido is low. He said that it is the medication. He asked if can you change it?

## 2020-03-11 NOTE — Telephone Encounter (Signed)
He can stop zoloft- remember depression causes decreased libido, we will start Viibryd, pick up sample pack , it does not affect Libido.  I will call in Rx also. lp

## 2020-03-11 NOTE — Telephone Encounter (Signed)
Patient was informed. He will come to the office to pick up the new medication.

## 2020-03-15 DIAGNOSIS — S8392XA Sprain of unspecified site of left knee, initial encounter: Secondary | ICD-10-CM | POA: Diagnosis not present

## 2020-03-19 ENCOUNTER — Other Ambulatory Visit: Payer: Self-pay | Admitting: Orthopaedic Surgery

## 2020-03-19 DIAGNOSIS — M25562 Pain in left knee: Secondary | ICD-10-CM

## 2020-03-28 ENCOUNTER — Other Ambulatory Visit: Payer: Self-pay

## 2020-03-28 MED ORDER — VILAZODONE HCL 20 MG PO TABS: 20.0000 mg | ORAL_TABLET | Freq: Every day | ORAL | 2 refills | Status: DC

## 2020-04-04 ENCOUNTER — Ambulatory Visit (INDEPENDENT_AMBULATORY_CARE_PROVIDER_SITE_OTHER): Payer: BC Managed Care – PPO | Admitting: Legal Medicine

## 2020-04-04 ENCOUNTER — Other Ambulatory Visit: Payer: Self-pay

## 2020-04-04 ENCOUNTER — Encounter: Payer: Self-pay | Admitting: Legal Medicine

## 2020-04-04 VITALS — BP 116/74 | HR 68 | Temp 97.2°F | Resp 18 | Ht 71.0 in | Wt 312.0 lb

## 2020-04-04 DIAGNOSIS — S8992XA Unspecified injury of left lower leg, initial encounter: Secondary | ICD-10-CM | POA: Insufficient documentation

## 2020-04-04 DIAGNOSIS — F322 Major depressive disorder, single episode, severe without psychotic features: Secondary | ICD-10-CM

## 2020-04-04 DIAGNOSIS — Z6841 Body Mass Index (BMI) 40.0 and over, adult: Secondary | ICD-10-CM | POA: Diagnosis not present

## 2020-04-04 MED ORDER — VILAZODONE HCL 20 MG PO TABS: 40.0000 mg | ORAL_TABLET | Freq: Every day | ORAL | 2 refills | Status: DC

## 2020-04-04 NOTE — Assessment & Plan Note (Signed)
Patient's depression is partially controlled with viibryd 20mg .   Anhedonia same since injury to knee.  PHQ 9 was performed score 17. An individual care plan was established or reinforced today.  The patient's disease status was assessed using clinical findings on exam, labs, and or other diagnostic testing to determine patient's success in meeting treatment goals based on disease specific evidence-based guidelines and found to be no changes Recommendations include increase Viibryd to 40mg .

## 2020-04-04 NOTE — Assessment & Plan Note (Signed)
Pain left knee with some crepitation.  Pain over joint line.  He is seeing orthopedics

## 2020-04-04 NOTE — Progress Notes (Signed)
Established Patient Office Visit  Subjective:  Patient ID: Mitchell Booker, male    DOB: 08-03-1981  Age: 39 y.o. MRN: [id number]  CC:  Chief Complaint  Patient presents with  . Depression    HPI Mitchell Booker presents for depression and knee injury.  Hurt left knee at work awaiting MRI and possible surgery. He is trying to walk but with pain.  He is seeing orthopedist.  This patient has major depression for 6 months.  PHQ9 =17.  Patient is having less anhedonia.  The patient has minimal improvement future plans and prospects.  The depression is worse with injury.  The patient is not exercising exercising and working on behavior to improve mental health.  Patient is not seeing a therapist or psychiatrist.  na  Patient is on viibryd.  Past Medical History:  Diagnosis Date  . Allergy    seasonal  . Anxiety   . Asthma   . Depression   . Essential hypertension 02/20/2020  . GERD (gastroesophageal reflux disease)   . GI bleed   . Heart murmur   . Hyperlipidemia   . Hypertension   . Mixed hyperlipidemia 02/20/2020  . Obesity     Past Surgical History:  Procedure Laterality Date  . wisdom teeth      Family History  Problem Relation Age of Onset  . Heart disease Father   . Diabetes Maternal Grandfather   . Colon cancer Neg Hx   . Colon polyps Neg Hx   . Esophageal cancer Neg Hx   . Prostate cancer Neg Hx   . Rectal cancer Neg Hx     Social History   Socioeconomic History  . Marital status: Married    Spouse name: Not on file  . Number of children: 1  . Years of education: Not on file  . Highest education level: Not on file  Occupational History    Employer: [employer]  Tobacco Use  . Smoking status: Former Smoker    Packs/day: 3.00    Years: 4.00    Pack years: 12.00    Quit date: 2003    Years since quitting: 18.3  . Smokeless tobacco: Current User    Types: Chew  Substance and Sexual Activity  . Alcohol use: Not Currently  . Drug use: Never  .  Sexual activity: Yes    Partners: Female  Other Topics Concern  . Not on file  Social History Narrative  . Not on file   Social Determinants of Health   Financial Resource Strain:   . Difficulty of Paying Living Expenses:   Food Insecurity:   . Worried About Charity fundraiser in the Last Year:   . Arboriculturist in the Last Year:   Transportation Needs:   . Film/video editor (Medical):   Marland Kitchen Lack of Transportation (Non-Medical):   Physical Activity:   . Days of Exercise per Week:   . Minutes of Exercise per Session:   Stress:   . Feeling of Stress :   Social Connections:   . Frequency of Communication with Friends and Family:   . Frequency of Social Gatherings with Friends and Family:   . Attends Religious Services:   . Active Member of Clubs or Organizations:   . Attends Archivist Meetings:   Marland Kitchen Marital Status:   Intimate Partner Violence:   . Fear of Current or Ex-Partner:   . Emotionally Abused:   Marland Kitchen Physically Abused:   . Sexually  Abused:     Outpatient Medications Prior to Visit  Medication Sig Dispense Refill  . cetirizine (ZYRTEC) 10 MG tablet Take 10 mg by mouth daily.    Marland Kitchen docusate sodium (COLACE) 100 MG capsule Take 200 mg by mouth daily.    . polyethylene glycol (MIRALAX / GLYCOLAX) 17 g packet Take 34 g by mouth daily.    . sertraline (ZOLOFT) 50 MG tablet Take 1 tablet (50 mg total) by mouth daily. 30 tablet 2  . Vilazodone HCl 20 MG TABS Take 1 tablet (20 mg total) by mouth daily. 30 tablet 2  . sertraline (ZOLOFT) 25 MG tablet      No facility-administered medications prior to visit.    Allergies  Allergen Reactions  . Codeine Rash  . Penicillins Rash    ROS Review of Systems  Constitutional: Negative.   HENT: Negative.   Eyes: Negative.   Cardiovascular: Negative.   Gastrointestinal: Negative.   Endocrine: Negative.   Genitourinary: Negative.   Musculoskeletal: Negative.   Skin: Negative.   Neurological: Negative.     Psychiatric/Behavioral: Negative.       Objective:    Physical Exam  Constitutional: He appears well-developed and well-nourished.  HENT:  Head: Normocephalic and atraumatic.  Right Ear: External ear normal.  Left Ear: External ear normal.  Nose: Nose normal.  Mouth/Throat: Oropharynx is clear and moist.  Eyes: Pupils are equal, round, and reactive to light. Conjunctivae and EOM are normal.  Cardiovascular: Normal rate, regular rhythm, normal heart sounds and intact distal pulses.  Pulmonary/Chest: Effort normal and breath sounds normal.  Musculoskeletal:     Cervical back: Normal range of motion and neck supple.  Vitals reviewed.   BP 116/74   Pulse 68   Temp (!) 97.2 F (36.2 C)   Resp 18   Ht 5\' 11"  (1.803 m)   Wt (!) 312 lb (141.5 kg)   SpO2 97%   BMI 43.52 kg/m  Wt Readings from Last 3 Encounters:  04/04/20 (!) 312 lb (141.5 kg)  03/05/20 (!) 313 lb (142 kg)  02/20/20 (!) 312 lb 6.4 oz (141.7 kg)     Health Maintenance Due  Topic Date Due  . HIV Screening  Never done  . COVID-19 Vaccine (1) Never done    There are no preventive care reminders to display for this patient.  No results found for: TSH Lab Results  Component Value Date   WBC 10.4 02/20/2020   HGB 15.9 02/20/2020   HCT 45.7 02/20/2020   MCV 85 02/20/2020   PLT 275 02/20/2020   Lab Results  Component Value Date   NA 140 02/20/2020   K 3.8 02/20/2020   CO2 21 02/20/2020   GLUCOSE 96 02/20/2020   BUN 11 02/20/2020   CREATININE 1.17 02/20/2020   BILITOT 0.4 02/20/2020   ALKPHOS 95 02/20/2020   AST 32 02/20/2020   ALT 35 02/20/2020   PROT 6.9 02/20/2020   ALBUMIN 4.4 02/20/2020   CALCIUM 9.5 02/20/2020   Lab Results  Component Value Date   CHOL 166 02/20/2020   Lab Results  Component Value Date   HDL 39 (L) 02/20/2020   Lab Results  Component Value Date   LDLCALC 92 02/20/2020   Lab Results  Component Value Date   TRIG 206 (H) 02/20/2020   Lab Results  Component  Value Date   CHOLHDL 4.3 02/20/2020   No results found for: HGBA1C    Assessment & Plan:   Problem List Items Addressed  This Visit      Other   Major depressive disorder, single episode, severe without psychotic features ([place]) - Primary    Patient's depression is partially controlled with viibryd 20mg .   Anhedonia same since injury to knee.  PHQ 9 was performed score 17. An individual care plan was established or reinforced today.  The patient's disease status was assessed using clinical findings on exam, labs, and or other diagnostic testing to determine patient's success in meeting treatment goals based on disease specific evidence-based guidelines and found to be no changes Recommendations include increase Viibryd to 40mg .      Relevant Medications   Vilazodone HCl 20 MG TABS   BMI 40.0-44.9, adult ([facility])   Left knee injury    Pain left knee with some crepitation.  Pain over joint line.  He is seeing orthopedics         Meds ordered this encounter  Medications  . Vilazodone HCl 20 MG TABS    Sig: Take 2 tablets (40 mg total) by mouth daily.    Dispense:  30 tablet    Refill:  2    Follow-up: Return in about 1 month (around 05/05/2020) for depression.    Reinaldo Meeker, MD

## 2020-04-09 ENCOUNTER — Ambulatory Visit
Admission: RE | Admit: 2020-04-09 | Discharge: 2020-04-09 | Disposition: A | Discharge status: Home / Self Care | Payer: Worker's Compensation | Source: Ambulatory Visit | Attending: Orthopaedic Surgery | Admitting: Orthopaedic Surgery

## 2020-04-09 DIAGNOSIS — M25562 Pain in left knee: Secondary | ICD-10-CM

## 2020-05-01 ENCOUNTER — Encounter (HOSPITAL_BASED_OUTPATIENT_CLINIC_OR_DEPARTMENT_OTHER): Payer: Self-pay | Admitting: Orthopaedic Surgery

## 2020-05-01 ENCOUNTER — Other Ambulatory Visit: Payer: Self-pay

## 2020-05-05 DIAGNOSIS — R072 Precordial pain: Secondary | ICD-10-CM | POA: Diagnosis not present

## 2020-05-05 DIAGNOSIS — R079 Chest pain, unspecified: Secondary | ICD-10-CM | POA: Diagnosis not present

## 2020-05-05 DIAGNOSIS — R778 Other specified abnormalities of plasma proteins: Secondary | ICD-10-CM | POA: Diagnosis not present

## 2020-05-05 DIAGNOSIS — F17220 Nicotine dependence, chewing tobacco, uncomplicated: Secondary | ICD-10-CM | POA: Diagnosis not present

## 2020-05-05 DIAGNOSIS — Z6841 Body Mass Index (BMI) 40.0 and over, adult: Secondary | ICD-10-CM | POA: Diagnosis not present

## 2020-05-05 DIAGNOSIS — I10 Essential (primary) hypertension: Secondary | ICD-10-CM | POA: Diagnosis not present

## 2020-05-05 DIAGNOSIS — F17200 Nicotine dependence, unspecified, uncomplicated: Secondary | ICD-10-CM | POA: Insufficient documentation

## 2020-05-05 DIAGNOSIS — I200 Unstable angina: Secondary | ICD-10-CM | POA: Diagnosis not present

## 2020-05-05 DIAGNOSIS — R7989 Other specified abnormal findings of blood chemistry: Secondary | ICD-10-CM | POA: Diagnosis not present

## 2020-05-05 DIAGNOSIS — Z79899 Other long term (current) drug therapy: Secondary | ICD-10-CM | POA: Diagnosis not present

## 2020-05-05 DIAGNOSIS — J811 Chronic pulmonary edema: Secondary | ICD-10-CM | POA: Diagnosis not present

## 2020-05-05 DIAGNOSIS — R0789 Other chest pain: Secondary | ICD-10-CM | POA: Diagnosis not present

## 2020-05-05 DIAGNOSIS — K648 Other hemorrhoids: Secondary | ICD-10-CM | POA: Diagnosis not present

## 2020-05-05 DIAGNOSIS — Z716 Tobacco abuse counseling: Secondary | ICD-10-CM | POA: Diagnosis not present

## 2020-05-05 DIAGNOSIS — Z713 Dietary counseling and surveillance: Secondary | ICD-10-CM | POA: Diagnosis not present

## 2020-05-05 DIAGNOSIS — F329 Major depressive disorder, single episode, unspecified: Secondary | ICD-10-CM | POA: Diagnosis not present

## 2020-05-05 DIAGNOSIS — J181 Lobar pneumonia, unspecified organism: Secondary | ICD-10-CM | POA: Diagnosis not present

## 2020-05-05 DIAGNOSIS — R0602 Shortness of breath: Secondary | ICD-10-CM | POA: Diagnosis not present

## 2020-05-06 ENCOUNTER — Other Ambulatory Visit (HOSPITAL_COMMUNITY): Payer: BC Managed Care – PPO

## 2020-05-06 DIAGNOSIS — K648 Other hemorrhoids: Secondary | ICD-10-CM | POA: Diagnosis not present

## 2020-05-06 DIAGNOSIS — R778 Other specified abnormalities of plasma proteins: Secondary | ICD-10-CM | POA: Diagnosis not present

## 2020-05-06 DIAGNOSIS — R079 Chest pain, unspecified: Secondary | ICD-10-CM | POA: Diagnosis not present

## 2020-05-06 DIAGNOSIS — R0789 Other chest pain: Secondary | ICD-10-CM | POA: Diagnosis not present

## 2020-05-06 DIAGNOSIS — Z713 Dietary counseling and surveillance: Secondary | ICD-10-CM | POA: Diagnosis not present

## 2020-05-06 DIAGNOSIS — I498 Other specified cardiac arrhythmias: Secondary | ICD-10-CM | POA: Diagnosis not present

## 2020-05-06 DIAGNOSIS — R072 Precordial pain: Secondary | ICD-10-CM | POA: Diagnosis not present

## 2020-05-07 MED ORDER — ALUM & MAG HYDROXIDE-SIMETH 200-200-20 MG/5ML PO SUSP
30.00 | ORAL | Status: DC
Start: ? — End: 2020-05-07

## 2020-05-07 MED ORDER — MELATONIN 3 MG PO TABS
3.00 | ORAL_TABLET | ORAL | Status: DC
Start: ? — End: 2020-05-07

## 2020-05-07 MED ORDER — ENOXAPARIN SODIUM 40 MG/0.4ML ~~LOC~~ SOLN
40.00 | SUBCUTANEOUS | Status: DC
Start: 2020-05-07 — End: 2020-05-07

## 2020-05-07 MED ORDER — DEXTROMETHORPHAN-GUAIFENESIN 10-100 MG/5ML PO LIQD
5.00 | ORAL | Status: DC
Start: ? — End: 2020-05-07

## 2020-05-07 MED ORDER — HYDRALAZINE HCL 10 MG PO TABS
10.00 | ORAL_TABLET | ORAL | Status: DC
Start: ? — End: 2020-05-07

## 2020-05-07 MED ORDER — DSS 100 MG PO CAPS
100.00 | ORAL_CAPSULE | ORAL | Status: DC
Start: ? — End: 2020-05-07

## 2020-05-07 MED ORDER — VILAZODONE HCL 10 MG PO TABS
40.00 | ORAL_TABLET | ORAL | Status: DC
Start: 2020-05-07 — End: 2020-05-07

## 2020-05-07 MED ORDER — DSS 100 MG PO CAPS
100.00 | ORAL_CAPSULE | ORAL | Status: DC
Start: 2020-05-06 — End: 2020-05-07

## 2020-05-07 MED ORDER — SODIUM CHLORIDE FLUSH 0.9 % IV SOLN
5.00 | INTRAVENOUS | Status: DC
Start: ? — End: 2020-05-07

## 2020-05-07 MED ORDER — ACETAMINOPHEN 325 MG PO TABS
650.00 | ORAL_TABLET | ORAL | Status: DC
Start: ? — End: 2020-05-07

## 2020-05-07 MED ORDER — BISACODYL 5 MG PO TBEC
10.00 | DELAYED_RELEASE_TABLET | ORAL | Status: DC
Start: ? — End: 2020-05-07

## 2020-05-07 MED ORDER — CETIRIZINE HCL 10 MG PO TABS
10.00 | ORAL_TABLET | ORAL | Status: DC
Start: 2020-05-07 — End: 2020-05-07

## 2020-05-07 MED ORDER — NITROGLYCERIN 0.4 MG SL SUBL
0.40 | SUBLINGUAL_TABLET | SUBLINGUAL | Status: DC
Start: ? — End: 2020-05-07

## 2020-05-07 MED ORDER — ONDANSETRON HCL 4 MG/2ML IJ SOLN
2.00 | INTRAMUSCULAR | Status: DC
Start: ? — End: 2020-05-07

## 2020-05-07 MED ORDER — POLYETHYLENE GLYCOL 3350 17 GM/SCOOP PO POWD
17.00 | ORAL | Status: DC
Start: 2020-05-07 — End: 2020-05-07

## 2020-05-07 MED ORDER — SODIUM CHLORIDE FLUSH 0.9 % IV SOLN
5.00 | INTRAVENOUS | Status: DC
Start: 2020-05-06 — End: 2020-05-07

## 2020-05-09 ENCOUNTER — Ambulatory Visit (HOSPITAL_BASED_OUTPATIENT_CLINIC_OR_DEPARTMENT_OTHER): Admit: 2020-05-09 | Payer: BC Managed Care – PPO | Admitting: Orthopaedic Surgery

## 2020-05-09 SURGERY — KNEE ARTHROSCOPY WITH ANTERIOR CRUCIATE LIGAMENT (ACL) REPAIR
Anesthesia: Choice | Site: Knee | Laterality: Left

## 2020-05-16 ENCOUNTER — Encounter: Payer: Self-pay | Admitting: Legal Medicine

## 2020-05-16 ENCOUNTER — Encounter (HOSPITAL_BASED_OUTPATIENT_CLINIC_OR_DEPARTMENT_OTHER): Payer: Self-pay | Admitting: Orthopaedic Surgery

## 2020-05-16 ENCOUNTER — Ambulatory Visit: Payer: BC Managed Care – PPO | Admitting: Legal Medicine

## 2020-05-16 ENCOUNTER — Other Ambulatory Visit: Payer: Self-pay

## 2020-05-16 VITALS — BP 120/90 | HR 97 | Temp 98.0°F | Resp 17 | Ht 71.0 in | Wt 318.0 lb

## 2020-05-16 DIAGNOSIS — R072 Precordial pain: Secondary | ICD-10-CM | POA: Diagnosis not present

## 2020-05-16 DIAGNOSIS — E782 Mixed hyperlipidemia: Secondary | ICD-10-CM

## 2020-05-16 DIAGNOSIS — I10 Essential (primary) hypertension: Secondary | ICD-10-CM

## 2020-05-16 DIAGNOSIS — J4520 Mild intermittent asthma, uncomplicated: Secondary | ICD-10-CM

## 2020-05-16 DIAGNOSIS — R079 Chest pain, unspecified: Secondary | ICD-10-CM | POA: Insufficient documentation

## 2020-05-16 DIAGNOSIS — F322 Major depressive disorder, single episode, severe without psychotic features: Secondary | ICD-10-CM

## 2020-05-16 MED ORDER — ALPRAZOLAM 0.5 MG PO TBDP: 0.5000 mg | ORAL_TABLET | Freq: Two times a day (BID) | ORAL | 0 refills | Status: DC | PRN

## 2020-05-16 MED ORDER — SERTRALINE HCL 50 MG PO TABS: 50.0000 mg | ORAL_TABLET | Freq: Every day | ORAL | 3 refills | Status: DC

## 2020-05-16 NOTE — Addendum Note (Signed)
Addended by: Thompson Caul I on: 05/16/2020 04:47 PM   Modules accepted: Orders

## 2020-05-16 NOTE — Assessment & Plan Note (Signed)
This patient has asthma mild and is on no medicines.  Patient is not having a flair.  Chronic medicines include none. Addition new medicines none.  Asthma action plan is in place.

## 2020-05-16 NOTE — Progress Notes (Addendum)
Established Patient Office Visit  Subjective:  Patient ID: Mitchell Booker, male    DOB: [birth year]  Age: 39 y.o. MRN: [id number]  CC:  Chief Complaint  Patient presents with  . Follow-up    [facility] on 05/05/2020 for precordial pain  . Chest Pain    Dull pain on the middles of the chest and it comes and goes     HPI Mitchell Booker presents for follow up from ER visit at [facility] with Chest pain.  Negative troponin and stress test.  He gets chest heaviness for years when he is stressed. He was on antidepressants.  He is planning ACL surgery and he is frustrated no being able to do his regular activity.  He plans ACL surgery next week.  He had no CHF, NO COPD, No MI or CAD, no renal disease.  He has hypercholesterolemia, no strokes or bleeding diathesis.  Patient presents for follow up of hypertension.  Patient tolerating diet and exercise well with side effects.  Patient was diagnosed with hypertension 2020 so has been treated for hypertension for 1 years.Patient is working on maintaining diet and exercise regimen and follows up as directed. Complication include none.  Past Medical History:  Diagnosis Date  . Essential hypertension 02/20/2020  . GERD (gastroesophageal reflux disease)   . Mixed hyperlipidemia 02/20/2020    Past Surgical History:  Procedure Laterality Date  . wisdom teeth      Family History  Problem Relation Age of Onset  . Heart disease Father   . Emphysema Father   . COPD Father   . Heart attack Father   . Diabetes Maternal Grandfather   . Osteoporosis Mother   . Colon cancer Neg Hx   . Colon polyps Neg Hx   . Esophageal cancer Neg Hx   . Prostate cancer Neg Hx   . Rectal cancer Neg Hx     Social History   Socioeconomic History  . Marital status: Married    Spouse name: Not on file  . Number of children: 1  . Years of education: Not on file  . Highest education level: Not on file  Occupational History  . Occupation: Sports coach: [employer]  Tobacco Use  . Smoking status: Former Smoker    Packs/day: 3.00    Years: 4.00    Pack years: 12.00    Quit date: 2003    Years since quitting: 18.4  . Smokeless tobacco: Current User    Types: Chew  . Tobacco comment: 1 can a day  Vaping Use  . Vaping Use: Never used  Substance and Sexual Activity  . Alcohol use: Not Currently  . Drug use: Never  . Sexual activity: Yes    Partners: Female  Other Topics Concern  . Not on file  Social History Narrative  . Not on file   Social Determinants of Health   Financial Resource Strain:   . Difficulty of Paying Living Expenses:   Food Insecurity:   . Worried About Charity fundraiser in the Last Year:   . Arboriculturist in the Last Year:   Transportation Needs:   . Film/video editor (Medical):   Marland Kitchen Lack of Transportation (Non-Medical):   Physical Activity:   . Days of Exercise per Week:   . Minutes of Exercise per Session:   Stress:   . Feeling of Stress :   Social Connections:   . Frequency of Communication  with Friends and Family:   . Frequency of Social Gatherings with Friends and Family:   . Attends Religious Services:   . Active Member of Clubs or Organizations:   . Attends Archivist Meetings:   Marland Kitchen Marital Status:   Intimate Partner Violence:   . Fear of Current or Ex-Partner:   . Emotionally Abused:   Marland Kitchen Physically Abused:   . Sexually Abused:     Outpatient Medications Prior to Visit  Medication Sig Dispense Refill  . cetirizine (ZYRTEC) 10 MG tablet Take 10 mg by mouth daily.    Marland Kitchen docusate sodium (COLACE) 100 MG capsule Take 200 mg by mouth daily.    . polyethylene glycol (MIRALAX / GLYCOLAX) 17 g packet Take 34 g by mouth daily.    . Vilazodone HCl 20 MG TABS Take 2 tablets (40 mg total) by mouth daily. (Patient taking differently: Take 40 mg by mouth daily. ) 30 tablet 2   No facility-administered medications prior to visit.    Allergies  Allergen Reactions  .  Codeine Rash  . Penicillins Rash    ROS Review of Systems  Constitutional: Negative.   HENT: Negative.   Eyes: Negative.   Respiratory: Negative.   Cardiovascular: Negative.   Gastrointestinal: Negative.   Endocrine: Negative.   Genitourinary: Negative.   Musculoskeletal: Negative.   Neurological: Negative.   Psychiatric/Behavioral: Positive for agitation. The patient is nervous/anxious.       Objective:    Physical Exam Vitals reviewed.  Constitutional:      Appearance: He is well-developed. He is obese.  HENT:     Head: Normocephalic and atraumatic.  Eyes:     Extraocular Movements: Extraocular movements intact.     Pupils: Pupils are equal, round, and reactive to light.  Cardiovascular:     Rate and Rhythm: Normal rate and regular rhythm.     Pulses: Normal pulses.     Heart sounds: Normal heart sounds.  Pulmonary:     Effort: Pulmonary effort is normal.     Breath sounds: Normal breath sounds.  Musculoskeletal:     Cervical back: Normal range of motion.     Comments: Pain left knee with instablility  Neurological:     General: No focal deficit present.     Mental Status: He is oriented to person, place, and time.   EKG: NSR rate 66, PR 172, QRS 96, QTC 406, Axis 50 degrees  BP 120/90 (BP Location: Left Arm, Patient Position: Sitting)   Pulse 97   Temp 98 F (36.7 C) (Temporal)   Resp 17   Ht 5\' 11"  (1.803 m)   Wt (!) 318 lb (144.2 kg)   SpO2 97%   BMI 44.35 kg/m  Wt Readings from Last 3 Encounters:  05/16/20 (!) 318 lb (144.2 kg)  04/04/20 (!) 312 lb (141.5 kg)  03/05/20 (!) 313 lb (142 kg)     Health Maintenance Due  Topic Date Due  . Hepatitis C Screening  Never done  . COVID-19 Vaccine (1) Never done  . HIV Screening  Never done    There are no preventive care reminders to display for this patient.  No results found for: TSH Lab Results  Component Value Date   WBC 10.4 02/20/2020   HGB 15.9 02/20/2020   HCT 45.7 02/20/2020   MCV 85  02/20/2020   PLT 275 02/20/2020   Lab Results  Component Value Date   NA 140 02/20/2020   K 3.8 02/20/2020  CO2 21 02/20/2020   GLUCOSE 96 02/20/2020   BUN 11 02/20/2020   CREATININE 1.17 02/20/2020   BILITOT 0.4 02/20/2020   ALKPHOS 95 02/20/2020   AST 32 02/20/2020   ALT 35 02/20/2020   PROT 6.9 02/20/2020   ALBUMIN 4.4 02/20/2020   CALCIUM 9.5 02/20/2020   Lab Results  Component Value Date   CHOL 166 02/20/2020   Lab Results  Component Value Date   HDL 39 (L) 02/20/2020   Lab Results  Component Value Date   LDLCALC 92 02/20/2020   Lab Results  Component Value Date   TRIG 206 (H) 02/20/2020   Lab Results  Component Value Date   CHOLHDL 4.3 02/20/2020   No results found for: HGBA1C    Assessment & Plan:   Problem List Items Addressed This Visit      Cardiovascular and Mediastinum   Essential hypertension    An individual hypertension care plan was established and reinforced today.  The patient's status was assessed using clinical findings on exam and labs or diagnostic tests. The patient's success at meeting treatment goals on disease specific evidence-based guidelines and found to be borderline controlled. SELF MANAGEMENT: The patient and I together assessed ways to personally work towards obtaining the recommended goals. He is on no medicines at present but we are watching. RECOMMENDATIONS: avoid decongestants found in common cold remedies, decrease consumption of alcohol, perform routine monitoring of BP with home BP cuff, exercise, reduction of dietary salt, take medicines as prescribed, try not to miss doses and quit smoking.  Regular exercise and maintaining a healthy weight is needed.  Stress reduction may help. A CLINICAL SUMMARY including written plan identify barriers to care unique to individual due to social or financial issues.  We attempt to mutually creat solutions for individual and family understanding.        Respiratory   Mild intermittent  asthma, uncomplicated    This patient has asthma mild and is on no medicines.  Patient is not having a flair.  Chronic medicines include none. Addition new medicines none.  Asthma action plan is in place.         Other   Mixed hyperlipidemia    AN INDIVIDUAL CARE PLAN for hyperlipidemia/ cholesterol was established and reinforced today.  The patient's status was assessed using clinical findings on exam, lab and other diagnostic tests. The patient's disease status was assessed based on evidence-based guidelines and found to be well controlled. Last LDL-c was <100 MEDICATIONS were reviewed. SELF MANAGEMENT GOALS have been discussed and patient's success at attaining the goal of low cholesterol was assessed. RECOMMENDATION given include regular exercise 3 days a week and low cholesterol/low fat diet. CLINICAL SUMMARY including written plan to identify barriers unique to the patient due to social or economic  reasons was discussed.      Major depressive disorder, single episode, severe without psychotic features ([place]) - Primary    Patient's depression is partially controlled with viibryd.   Anhedonia same.  PHQ 9 was was performed score 9. An individual care plan was established or reinforced today.  The patient's disease status was assessed using clinical findings on exam, labs, and or other diagnostic testing to determine patient's success in meeting treatment goals based on disease specific evidence-based guidelines and found to be improving.  He did better on zoloft and we are changing him to 50mg  and stop viibryd.  I gave him xanax for probably panic attacks of chest pain.  Relevant Medications   sertraline (ZOLOFT) 50 MG tablet   ALPRAZolam (NIRAVAM) 0.5 MG dissolvable tablet   Morbid obesity ([place])    An individualize plan was formulated for obesityusing patient history and physical exam to encourage weight loss.  An evidence based program was formulated.  Patient is to cut portion size  with meals and to plan physical exercise 3 days a week at least 20 minutes.  Weight watchers and other programs are helpful.  Planned amount of weight loss 10 lbs.      Chest pain    Patient had negative workup at [place].  It appears more like panic disorder.       OK for outpatient surgery.  Meds ordered this encounter  Medications  . sertraline (ZOLOFT) 50 MG tablet    Sig: Take 1 tablet (50 mg total) by mouth daily.    Dispense:  30 tablet    Refill:  3  . ALPRAZolam (NIRAVAM) 0.5 MG dissolvable tablet    Sig: Take 1 tablet (0.5 mg total) by mouth 2 (two) times daily as needed for anxiety.    Dispense:  20 tablet    Refill:  0    Follow-up: Return in about 3 months (around 08/16/2020) for fasting.    Reinaldo Meeker, MD

## 2020-05-16 NOTE — Assessment & Plan Note (Signed)
An individualize plan was formulated for obesity using patient history and physical exam to encourage weight loss.  An evidence based program was formulated.  Patient is to cut portion size with meals and to plan physical exercise 3 days a week at least 20 minutes.  Weight watchers and other programs are helpful.  Planned amount of weight loss 10 lbs. 

## 2020-05-16 NOTE — Assessment & Plan Note (Signed)
AN INDIVIDUAL CARE PLAN for hyperlipidemia/ cholesterol was established and reinforced today.  The patient's status was assessed using clinical findings on exam, lab and other diagnostic tests. The patient's disease status was assessed based on evidence-based guidelines and found to be well controlled. Last LDL-c was <100 MEDICATIONS were reviewed. SELF MANAGEMENT GOALS have been discussed and patient's success at attaining the goal of low cholesterol was assessed. RECOMMENDATION given include regular exercise 3 days a week and low cholesterol/low fat diet. CLINICAL SUMMARY including written plan to identify barriers unique to the patient due to social or economic  reasons was discussed.

## 2020-05-16 NOTE — Assessment & Plan Note (Signed)
Patient's depression is partially controlled with viibryd.   Anhedonia same.  PHQ 9 was was performed score 9. An individual care plan was established or reinforced today.  The patient's disease status was assessed using clinical findings on exam, labs, and or other diagnostic testing to determine patient's success in meeting treatment goals based on disease specific evidence-based guidelines and found to be improving.  He did better on zoloft and we are changing him to 50mg  and stop viibryd.  I gave him xanax for probably panic attacks of chest pain.

## 2020-05-16 NOTE — Assessment & Plan Note (Signed)
An individual hypertension care plan was established and reinforced today.  The patient's status was assessed using clinical findings on exam and labs or diagnostic tests. The patient's success at meeting treatment goals on disease specific evidence-based guidelines and found to be borderline controlled. SELF MANAGEMENT: The patient and I together assessed ways to personally work towards obtaining the recommended goals. He is on no medicines at present but we are watching. RECOMMENDATIONS: avoid decongestants found in common cold remedies, decrease consumption of alcohol, perform routine monitoring of BP with home BP cuff, exercise, reduction of dietary salt, take medicines as prescribed, try not to miss doses and quit smoking.  Regular exercise and maintaining a healthy weight is needed.  Stress reduction may help. A CLINICAL SUMMARY including written plan identify barriers to care unique to individual due to social or financial issues.  We attempt to mutually creat solutions for individual and family understanding.

## 2020-05-16 NOTE — Assessment & Plan Note (Signed)
Patient had negative workup at [place].  It appears more like panic disorder.

## 2020-05-18 ENCOUNTER — Other Ambulatory Visit (HOSPITAL_COMMUNITY)
Admission: RE | Admit: 2020-05-18 | Discharge: 2020-05-18 | Disposition: A | Discharge status: Home / Self Care | Payer: BC Managed Care – PPO | Source: Ambulatory Visit | Attending: Orthopaedic Surgery | Admitting: Orthopaedic Surgery

## 2020-05-18 DIAGNOSIS — Z20822 Contact with and (suspected) exposure to covid-19: Secondary | ICD-10-CM | POA: Insufficient documentation

## 2020-05-18 DIAGNOSIS — Z01812 Encounter for preprocedural laboratory examination: Secondary | ICD-10-CM | POA: Insufficient documentation

## 2020-05-18 LAB — SARS CORONAVIRUS 2 (TAT 6-24 HRS): SARS Coronavirus 2: NEGATIVE

## 2020-05-20 NOTE — Progress Notes (Signed)

## 2020-05-21 NOTE — H&P (Signed)
PREOPERATIVE H&P  Chief Complaint: LEFT ANTERIOR CRUCIATE LIGAMENT SPRAIN, LEFT MEDIAL MENISCUS TEAR  HPI: Mitchell Booker is a 39 y.o. male who is scheduled for KNEE ARTHROSCOPY WITH ANTERIOR CRUCIATE LIGAMENT (ACL) REPAIR KNEE ARTHROSCOPY WITH MEDIAL MENISECTOMY.   Patient has a past medical history significant for hyperlipidemia, GERD, HTN.   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  It began when he jumped off the back of a very tall truck.  This caused him to fall into the bushes.  Injury was on 03/15/2020.  Following the fall he heard a pop in his knee.  This was associated with swelling and inability to bear weight.  He was seen at urgent care and x-rays were performed.  He was given crutches and a brace and told to follow up with orthopedics.  He still continues to have some pain with weightbearing.  He still has instability in the knee and gets swelling and pain in the knee.  His symptoms are rated as moderate to severe, and have been worsening.  This is significantly impairing activities of daily living.    Please see clinic note for further details on this patient's care.    He has elected for surgical management.   Past Medical History:  Diagnosis Date  . Essential hypertension 02/20/2020  . GERD (gastroesophageal reflux disease)   . Mixed hyperlipidemia 02/20/2020   Past Surgical History:  Procedure Laterality Date  . wisdom teeth     Social History   Socioeconomic History  . Marital status: Married    Spouse name: Not on file  . Number of children: 1  . Years of education: Not on file  . Highest education level: Not on file  Occupational History  . Occupation: Charity fundraiser: [employer]  Tobacco Use  . Smoking status: Former Smoker    Packs/day: 3.00    Years: 4.00    Pack years: 12.00    Quit date: 2003    Years since quitting: 18.4  . Smokeless tobacco: Current User      Types: Chew  . Tobacco comment: 1 can a day  Vaping Use  . Vaping Use: Never used  Substance and Sexual Activity  . Alcohol use: Not Currently  . Drug use: Never  . Sexual activity: Yes    Partners: Female  Other Topics Concern  . Not on file  Social History Narrative  . Not on file   Social Determinants of Health   Financial Resource Strain:   . Difficulty of Paying Living Expenses:   Food Insecurity:   . Worried About Charity fundraiser in the Last Year:   . Arboriculturist in the Last Year:   Transportation Needs:   . Film/video editor (Medical):   Marland Kitchen Lack of Transportation (Non-Medical):   Physical Activity:   . Days of Exercise per Week:   . Minutes of Exercise per Session:   Stress:   . Feeling of Stress :   Social Connections:   . Frequency of Communication with Friends and Family:   . Frequency of Social Gatherings with Friends and Family:   . Attends Religious Services:   . Active Member of Clubs or Organizations:   . Attends Archivist Meetings:   Marland Kitchen Marital Status:    Family History  Problem Relation Age of Onset  . Heart disease Father   .  Emphysema Father   . COPD Father   . Heart attack Father   . Diabetes Maternal Grandfather   . Osteoporosis Mother   . Colon cancer Neg Hx   . Colon polyps Neg Hx   . Esophageal cancer Neg Hx   . Prostate cancer Neg Hx   . Rectal cancer Neg Hx    Allergies  Allergen Reactions  . Codeine Rash  . Penicillins Rash   Prior to Admission medications   Medication Sig Start Date End Date Taking? Authorizing Provider  cetirizine (ZYRTEC) 10 MG tablet Take 10 mg by mouth daily.   Yes [provider]  docusate sodium (COLACE) 100 MG capsule Take 200 mg by mouth daily.   Yes [provider]  polyethylene glycol (MIRALAX / GLYCOLAX) 17 g packet Take 34 g by mouth daily.   Yes [provider]  ALPRAZolam (NIRAVAM) 0.5 MG dissolvable tablet Take 1 tablet (0.5 mg total) by mouth 2  (two) times daily as needed for anxiety. 05/16/20   Lillard Anes, MD  sertraline (ZOLOFT) 50 MG tablet Take 1 tablet (50 mg total) by mouth daily. 05/16/20   Lillard Anes, MD    ROS: All other systems have been reviewed and were otherwise negative with the exception of those mentioned in the HPI and as above.  Physical Exam: General: Alert, no acute distress Cardiovascular: No pedal edema Respiratory: No cyanosis, no use of accessory musculature GI: No organomegaly, abdomen is soft and non-tender Skin: No lesions in the area of chief complaint Neurologic: Sensation intact distally Psychiatric: Patient is competent for consent with normal mood and affect Lymphatic: No axillary or cervical lymphadenopathy  MUSCULOSKELETAL:  Left knee: Positive Lachman. Positive medial tenderness to palpation.  No other obvious ligamentous injury.    Imaging: MRI demonstrates a complete ACL tear and a complex medial meniscus tear.   Assessment: LEFT ANTERIOR CRUCIATE LIGAMENT SPRAIN, LEFT MEDIAL MENISCUS TEAR  Plan: Plan for Procedure(s): KNEE ARTHROSCOPY WITH ANTERIOR CRUCIATE LIGAMENT (ACL) REPAIR KNEE ARTHROSCOPY WITH MEDIAL MENISECTOMY  At his young age and with his functional instability we think he would benefit from surgical management.  We talked about quadriceps autograft ACL reconstruction and partial medial meniscectomy.  Patient understands the plan of care, as well as the lengthy rehab.  He will be on light duty until his surgery, he will be out of work for two weeks after surgery and then he will go back with restrictions.    The risks benefits and alternatives were discussed with the patient including but not limited to the risks of nonoperative treatment, versus surgical intervention including infection, bleeding, nerve injury,  blood clots, cardiopulmonary complications, morbidity, mortality, among others, and they were willing to proceed.   The patient acknowledged  the explanation, agreed to proceed with the plan and consent was signed.   Patient received pre-operative clearance from his PCP, Dr. Reinaldo Meeker. Recent ER visit at [facility] for chest pain with negative work up.   Operative Plan: Left knee scope with ACL reconstruction and medial meniscectomy Discharge Medications: Tylenol, Celebrex, Oxycodone, Zofran DVT Prophylaxis: Aspirin Physical Therapy: Outpatient PT Special Discharge needs: Knee immobilizer   Ethelda Chick, PA-C  05/21/2020 4:56 PM

## 2020-05-22 ENCOUNTER — Ambulatory Visit (HOSPITAL_BASED_OUTPATIENT_CLINIC_OR_DEPARTMENT_OTHER)
Admission: RE | Admit: 2020-05-22 | Discharge: 2020-05-22 | Disposition: A | Discharge status: Home / Self Care | Payer: Worker's Compensation | Source: Ambulatory Visit | Attending: Orthopaedic Surgery | Admitting: Orthopaedic Surgery

## 2020-05-22 ENCOUNTER — Encounter (HOSPITAL_BASED_OUTPATIENT_CLINIC_OR_DEPARTMENT_OTHER)
Admission: RE | Disposition: A | Discharge status: Home / Self Care | Payer: Self-pay | Source: Ambulatory Visit | Attending: Orthopaedic Surgery

## 2020-05-22 ENCOUNTER — Other Ambulatory Visit: Payer: Self-pay

## 2020-05-22 ENCOUNTER — Encounter (HOSPITAL_BASED_OUTPATIENT_CLINIC_OR_DEPARTMENT_OTHER): Payer: Self-pay | Admitting: Orthopaedic Surgery

## 2020-05-22 ENCOUNTER — Ambulatory Visit (HOSPITAL_BASED_OUTPATIENT_CLINIC_OR_DEPARTMENT_OTHER): Payer: Worker's Compensation | Admitting: Anesthesiology

## 2020-05-22 DIAGNOSIS — S83222A Peripheral tear of medial meniscus, current injury, left knee, initial encounter: Secondary | ICD-10-CM | POA: Insufficient documentation

## 2020-05-22 DIAGNOSIS — Z6841 Body Mass Index (BMI) 40.0 and over, adult: Secondary | ICD-10-CM | POA: Diagnosis not present

## 2020-05-22 DIAGNOSIS — Z87891 Personal history of nicotine dependence: Secondary | ICD-10-CM | POA: Insufficient documentation

## 2020-05-22 DIAGNOSIS — W1789XA Other fall from one level to another, initial encounter: Secondary | ICD-10-CM | POA: Insufficient documentation

## 2020-05-22 DIAGNOSIS — S83512A Sprain of anterior cruciate ligament of left knee, initial encounter: Secondary | ICD-10-CM | POA: Insufficient documentation

## 2020-05-22 DIAGNOSIS — S83282A Other tear of lateral meniscus, current injury, left knee, initial encounter: Secondary | ICD-10-CM | POA: Diagnosis not present

## 2020-05-22 HISTORY — PX: KNEE ARTHROSCOPY WITH MENISCAL REPAIR: SHX5653

## 2020-05-22 HISTORY — PX: KNEE ARTHROSCOPY WITH ANTERIOR CRUCIATE LIGAMENT (ACL) REPAIR: SHX5644

## 2020-05-22 SURGERY — KNEE ARTHROSCOPY WITH ANTERIOR CRUCIATE LIGAMENT (ACL) REPAIR
Anesthesia: General | Site: Knee | Laterality: Left

## 2020-05-22 MED ORDER — CLINDAMYCIN PHOSPHATE 900 MG/50ML IV SOLN
INTRAVENOUS | Status: AC
  Filled 2020-05-22: qty 50

## 2020-05-22 MED ORDER — PROMETHAZINE HCL 25 MG/ML IJ SOLN: 6.2500 mg | INTRAMUSCULAR | Status: DC | PRN

## 2020-05-22 MED ORDER — MEPERIDINE HCL 25 MG/ML IJ SOLN: 6.2500 mg | INTRAMUSCULAR | Status: DC | PRN

## 2020-05-22 MED ORDER — EPINEPHRINE PF 1 MG/ML IJ SOLN
INTRAMUSCULAR | Status: AC
  Filled 2020-05-22: qty 1

## 2020-05-22 MED ORDER — MIDAZOLAM HCL 2 MG/2ML IJ SOLN
2.0000 mg | Freq: Once | INTRAMUSCULAR | Status: AC
  Administered 2020-05-22: 2 mg via INTRAVENOUS

## 2020-05-22 MED ORDER — OXYCODONE HCL 5 MG PO TABS
ORAL_TABLET | ORAL | Status: AC
  Filled 2020-05-22: qty 1

## 2020-05-22 MED ORDER — ONDANSETRON HCL 4 MG/2ML IJ SOLN
INTRAMUSCULAR | Status: DC | PRN
  Administered 2020-05-22: 4 mg via INTRAVENOUS

## 2020-05-22 MED ORDER — ROCURONIUM BROMIDE 100 MG/10ML IV SOLN
INTRAVENOUS | Status: DC | PRN
  Administered 2020-05-22: 60 mg via INTRAVENOUS

## 2020-05-22 MED ORDER — SODIUM CHLORIDE 0.9 % IR SOLN
Status: DC | PRN
  Administered 2020-05-22: 6000 mL

## 2020-05-22 MED ORDER — CELECOXIB 100 MG PO CAPS
100.0000 mg | ORAL_CAPSULE | Freq: Two times a day (BID) | ORAL | 0 refills | Status: AC
Start: 2020-05-22 — End: 2020-06-21

## 2020-05-22 MED ORDER — BUPIVACAINE HCL (PF) 0.25 % IJ SOLN
INTRAMUSCULAR | Status: AC
  Filled 2020-05-22: qty 30

## 2020-05-22 MED ORDER — LACTATED RINGERS IV SOLN: INTRAVENOUS | Status: DC

## 2020-05-22 MED ORDER — OXYCODONE HCL 5 MG/5ML PO SOLN: 5.0000 mg | Freq: Once | ORAL | Status: AC | PRN

## 2020-05-22 MED ORDER — FENTANYL CITRATE (PF) 100 MCG/2ML IJ SOLN
INTRAMUSCULAR | Status: AC
  Filled 2020-05-22: qty 2

## 2020-05-22 MED ORDER — SUGAMMADEX SODIUM 200 MG/2ML IV SOLN
INTRAVENOUS | Status: DC | PRN
  Administered 2020-05-22: 200 mg via INTRAVENOUS

## 2020-05-22 MED ORDER — BUPIVACAINE-EPINEPHRINE (PF) 0.5% -1:200000 IJ SOLN
INTRAMUSCULAR | Status: DC | PRN
Start: 2020-05-22 — End: 2020-05-22
  Administered 2020-05-22: 30 mL via PERINEURAL

## 2020-05-22 MED ORDER — FENTANYL CITRATE (PF) 100 MCG/2ML IJ SOLN
INTRAMUSCULAR | Status: DC | PRN
  Administered 2020-05-22 (×2): 50 ug via INTRAVENOUS

## 2020-05-22 MED ORDER — ASPIRIN 81 MG PO CHEW
81.0000 mg | CHEWABLE_TABLET | Freq: Two times a day (BID) | ORAL | 0 refills | Status: AC
Start: 2020-05-22 — End: 2020-07-03

## 2020-05-22 MED ORDER — VANCOMYCIN HCL 1000 MG IV SOLR
INTRAVENOUS | Status: DC | PRN
  Administered 2020-05-22: 500 mg

## 2020-05-22 MED ORDER — DIPHENHYDRAMINE HCL 25 MG PO CAPS: 25.0000 mg | ORAL_CAPSULE | Freq: Four times a day (QID) | ORAL | 0 refills | Status: DC | PRN

## 2020-05-22 MED ORDER — CLINDAMYCIN PHOSPHATE 900 MG/50ML IV SOLN
900.0000 mg | INTRAVENOUS | Status: AC
  Administered 2020-05-22: 900 mg via INTRAVENOUS

## 2020-05-22 MED ORDER — VANCOMYCIN HCL 500 MG IV SOLR
INTRAVENOUS | Status: AC
  Filled 2020-05-22: qty 500

## 2020-05-22 MED ORDER — MIDAZOLAM HCL 2 MG/2ML IJ SOLN
INTRAMUSCULAR | Status: AC
  Filled 2020-05-22: qty 2

## 2020-05-22 MED ORDER — HYDROMORPHONE HCL 1 MG/ML IJ SOLN: 0.2500 mg | INTRAMUSCULAR | Status: DC | PRN

## 2020-05-22 MED ORDER — LIDOCAINE 2% (20 MG/ML) 5 ML SYRINGE
INTRAMUSCULAR | Status: AC
  Filled 2020-05-22: qty 5

## 2020-05-22 MED ORDER — DEXAMETHASONE SODIUM PHOSPHATE 4 MG/ML IJ SOLN
INTRAMUSCULAR | Status: DC | PRN
Start: 2020-05-22 — End: 2020-05-22
  Administered 2020-05-22: 8 mg via PERINEURAL

## 2020-05-22 MED ORDER — ONDANSETRON HCL 4 MG PO TABS: 4.0000 mg | ORAL_TABLET | Freq: Three times a day (TID) | ORAL | 1 refills | Status: AC | PRN

## 2020-05-22 MED ORDER — PROPOFOL 10 MG/ML IV BOLUS
INTRAVENOUS | Status: DC | PRN
  Administered 2020-05-22: 250 mg via INTRAVENOUS

## 2020-05-22 MED ORDER — ONDANSETRON HCL 4 MG/2ML IJ SOLN
INTRAMUSCULAR | Status: AC
  Filled 2020-05-22: qty 2

## 2020-05-22 MED ORDER — PROPOFOL 500 MG/50ML IV EMUL
INTRAVENOUS | Status: AC
  Filled 2020-05-22: qty 50

## 2020-05-22 MED ORDER — OXYCODONE HCL 5 MG PO TABS: ORAL_TABLET | ORAL | 0 refills | Status: AC

## 2020-05-22 MED ORDER — DEXAMETHASONE SODIUM PHOSPHATE 4 MG/ML IJ SOLN
INTRAMUSCULAR | Status: DC | PRN
Start: 2020-05-22 — End: 2020-05-22
  Administered 2020-05-22: 4 mg via INTRAVENOUS

## 2020-05-22 MED ORDER — ACETAMINOPHEN 500 MG PO TABS
1000.0000 mg | ORAL_TABLET | Freq: Three times a day (TID) | ORAL | 0 refills | Status: AC
Start: 2020-05-22 — End: 2020-06-05

## 2020-05-22 MED ORDER — OXYCODONE HCL 5 MG PO TABS
5.0000 mg | ORAL_TABLET | Freq: Once | ORAL | Status: AC | PRN
  Administered 2020-05-22: 5 mg via ORAL

## 2020-05-22 MED ORDER — LIDOCAINE HCL (PF) 1 % IJ SOLN
INTRAMUSCULAR | Status: AC
  Filled 2020-05-22: qty 30

## 2020-05-22 MED ORDER — FENTANYL CITRATE (PF) 100 MCG/2ML IJ SOLN
100.0000 ug | Freq: Once | INTRAMUSCULAR | Status: AC
  Administered 2020-05-22: 100 ug via INTRAVENOUS

## 2020-05-22 MED ORDER — CLONIDINE HCL (ANALGESIA) 100 MCG/ML EP SOLN
EPIDURAL | Status: DC | PRN
  Administered 2020-05-22: 100 ug

## 2020-05-22 MED ORDER — DEXAMETHASONE SODIUM PHOSPHATE 10 MG/ML IJ SOLN
INTRAMUSCULAR | Status: AC
  Filled 2020-05-22: qty 1

## 2020-05-22 SURGICAL SUPPLY — 90 items
BLADE SHAVER BONE 5.0MM X 13CM (MISCELLANEOUS) ×1
BLADE SHAVER BONE 5.0X13 (MISCELLANEOUS) ×3 IMPLANT
BLADE SURG 10 STRL SS (BLADE) ×4 IMPLANT
BLADE SURG 15 STRL LF DISP TIS (BLADE) ×2 IMPLANT
BLADE SURG 15 STRL SS (BLADE) ×2
BNDG COHESIVE 4X5 TAN STRL (GAUZE/BANDAGES/DRESSINGS) ×4 IMPLANT
BNDG ELASTIC 6X5.8 VLCR STR LF (GAUZE/BANDAGES/DRESSINGS) ×4 IMPLANT
BONE TUNNEL PLUG CANNULATED (MISCELLANEOUS) IMPLANT
BURR OVAL 8 FLU 4.0MM X 13CM (MISCELLANEOUS)
BURR OVAL 8 FLU 4.0X13 (MISCELLANEOUS) IMPLANT
CHLORAPREP W/TINT 26 (MISCELLANEOUS) ×4 IMPLANT
CLOSURE STERI-STRIP 1/2X4 (GAUZE/BANDAGES/DRESSINGS) ×1
CLSR STERI-STRIP ANTIMIC 1/2X4 (GAUZE/BANDAGES/DRESSINGS) ×3 IMPLANT
COVER BACK TABLE 60X90IN (DRAPES) ×4 IMPLANT
CUFF TOURN SGL QUICK 34 (TOURNIQUET CUFF) ×2
CUFF TRNQT CYL 34X4.125X (TOURNIQUET CUFF) ×2 IMPLANT
CUTTER TENSIONER SUT 2-0 0 FBW (INSTRUMENTS) ×4 IMPLANT
DISSECTOR 3.5MM X 13CM CVD (MISCELLANEOUS) ×4 IMPLANT
DISSECTOR 4.0MMX13CM CVD (MISCELLANEOUS) IMPLANT
DRAPE ARTHROSCOPY W/POUCH 90 (DRAPES) ×4 IMPLANT
DRAPE IMP U-DRAPE 54X76 (DRAPES) ×4 IMPLANT
DRAPE OEC MINIVIEW 54X84 (DRAPES) ×4 IMPLANT
DRAPE TOP ARMCOVERS (MISCELLANEOUS) ×4 IMPLANT
DRAPE U-SHAPE 47X51 STRL (DRAPES) ×4 IMPLANT
FIBERSTICK 2 (SUTURE) IMPLANT
GAUZE SPONGE 4X4 12PLY STRL (GAUZE/BANDAGES/DRESSINGS) ×8 IMPLANT
GLOVE BIO SURGEON STRL SZ 6.5 (GLOVE) ×6 IMPLANT
GLOVE BIO SURGEONS STRL SZ 6.5 (GLOVE) ×2
GLOVE BIOGEL PI IND STRL 6.5 (GLOVE) ×4 IMPLANT
GLOVE BIOGEL PI IND STRL 7.0 (GLOVE) ×2 IMPLANT
GLOVE BIOGEL PI IND STRL 8 (GLOVE) ×2 IMPLANT
GLOVE BIOGEL PI INDICATOR 6.5 (GLOVE) ×4
GLOVE BIOGEL PI INDICATOR 7.0 (GLOVE) ×2
GLOVE BIOGEL PI INDICATOR 8 (GLOVE) ×2
GLOVE ECLIPSE 6.5 STRL STRAW (GLOVE) ×4 IMPLANT
GLOVE ECLIPSE 8.0 STRL XLNG CF (GLOVE) ×8 IMPLANT
GOWN STRL REUS W/ TWL LRG LVL3 (GOWN DISPOSABLE) ×4 IMPLANT
GOWN STRL REUS W/ TWL XL LVL3 (GOWN DISPOSABLE) ×2 IMPLANT
GOWN STRL REUS W/TWL LRG LVL3 (GOWN DISPOSABLE) ×4
GOWN STRL REUS W/TWL XL LVL3 (GOWN DISPOSABLE) ×6 IMPLANT
GUIDEPIN FLEX PATHFINDER 2.4MM (WIRE) ×4 IMPLANT
HARVESTER TENDON QUADPRO 9 (ORTHOPEDIC DISPOSABLE SUPPLIES) ×4 IMPLANT
IMPL FIBERSTICH 2-0 CVD (Anchor) ×6 IMPLANT
IMPL SCREW BIO 8X30 (Screw) ×2 IMPLANT
IMPL TIGHTROP FIBERTAG ACL (Orthopedic Implant) ×2 IMPLANT
IMPLANT FIBERSTICH 2-0 CVD (Anchor) ×12 IMPLANT
IMPLANT SCREW BIO 8X30 (Screw) ×4 IMPLANT
IMPLANT TIGHTROPE FIBERTAG ACL (Orthopedic Implant) ×4 IMPLANT
IV NS IRRIG 3000ML ARTHROMATIC (IV SOLUTION) ×16 IMPLANT
KIT TRANSTIBIAL (DISPOSABLE) ×4 IMPLANT
KIT TURNOVER KIT B (KITS) ×4 IMPLANT
MANIFOLD NEPTUNE II (INSTRUMENTS) ×4 IMPLANT
NDL SAFETY ECLIPSE 18X1.5 (NEEDLE) ×2 IMPLANT
NEEDLE HYPO 18GX1.5 SHARP (NEEDLE) ×2
NS IRRIG 1000ML POUR BTL (IV SOLUTION) ×4 IMPLANT
PACK DSU ARTHROSCOPY (CUSTOM PROCEDURE TRAY) ×4 IMPLANT
PAD CAST 4YDX4 CTTN HI CHSV (CAST SUPPLIES) ×2 IMPLANT
PADDING CAST COTTON 4X4 STRL (CAST SUPPLIES) ×2
PENCIL SMOKE EVACUATOR (MISCELLANEOUS) ×4 IMPLANT
PORT APPOLLO RF 90DEGREE MULTI (SURGICAL WAND) ×4 IMPLANT
PORTAL SKID DEVICE (INSTRUMENTS) ×4 IMPLANT
REAMER FLEX GUIDE PIN 9MM (DRILL) ×2 IMPLANT
REAMER/FLEX GUIDE PIN 9MM (DRILL) ×4
SET BASIN DAY SURGERY F.S. (CUSTOM PROCEDURE TRAY) ×4 IMPLANT
SHEET MEDIUM DRAPE 40X70 STRL (DRAPES) ×4 IMPLANT
SLEEVE SCD COMPRESS KNEE MED (MISCELLANEOUS) ×4 IMPLANT
SPONGE LAP 4X18 RFD (DISPOSABLE) ×4 IMPLANT
SUT 0 FIBERLOOP 38 BLUE TPR ND (SUTURE)
SUT FIBERWIRE #2 38 T-5 BLUE (SUTURE)
SUT MNCRL AB 4-0 PS2 18 (SUTURE) ×4 IMPLANT
SUT VIC AB 0 CT1 27 (SUTURE)
SUT VIC AB 0 CT1 27XBRD ANBCTR (SUTURE) IMPLANT
SUT VIC AB 1 CT1 27 (SUTURE) ×2
SUT VIC AB 1 CT1 27XBRD ANBCTR (SUTURE) ×2 IMPLANT
SUT VIC AB 2-0 SH 27 (SUTURE) ×2
SUT VIC AB 2-0 SH 27XBRD (SUTURE) ×2 IMPLANT
SUT VIC AB 3-0 SH 27 (SUTURE) ×2
SUT VIC AB 3-0 SH 27X BRD (SUTURE) ×2 IMPLANT
SUTURE 0 FIBERLP 38 BLU TPR ND (SUTURE) IMPLANT
SUTURE FIBERWR #2 38 T-5 BLUE (SUTURE) IMPLANT
SUTURE TAPE 1.3 FIBERLOP 20 ST (SUTURE) ×2 IMPLANT
SUTURETAPE 1.3 FIBERLOOP 20 ST (SUTURE) ×4
SYR 5ML LL (SYRINGE) ×4 IMPLANT
SYR 5ML LUER SLIP (SYRINGE) ×4 IMPLANT
TOWEL GREEN STERILE FF (TOWEL DISPOSABLE) ×8 IMPLANT
TUBE CONNECTING 20'X1/4 (TUBING) ×1
TUBE CONNECTING 20X1/4 (TUBING) ×3 IMPLANT
TUBE SUCTION HIGH CAP CLEAR NV (SUCTIONS) ×4 IMPLANT
TUBING ARTHROSCOPY IRRIG 16FT (MISCELLANEOUS) ×4 IMPLANT
WATER STERILE IRR 1000ML POUR (IV SOLUTION) ×4 IMPLANT

## 2020-05-22 NOTE — Anesthesia Procedure Notes (Signed)
Procedure Name: Intubation Date/Time: 05/22/2020 11:32 AM Performed by: Maryella Shivers, CRNA Pre-anesthesia Checklist: Patient identified, Emergency Drugs available, Suction available and Patient being monitored Patient Re-evaluated:Patient Re-evaluated prior to induction Oxygen Delivery Method: Circle system utilized Preoxygenation: Pre-oxygenation with 100% oxygen Induction Type: IV induction Ventilation: Mask ventilation without difficulty Laryngoscope Size: Mac and 4 Grade View: Grade II Tube type: Oral Tube size: 8.0 mm Number of attempts: 1 Airway Equipment and Method: Stylet and Oral airway Placement Confirmation: ETT inserted through vocal cords under direct vision,  positive ETCO2 and breath sounds checked- equal and bilateral Secured at: 23 cm Tube secured with: Tape Dental Injury: Teeth and Oropharynx as per pre-operative assessment

## 2020-05-22 NOTE — Progress Notes (Signed)
AssistedDr. Germeroth with left, ultrasound guided, adductor canal block. Side rails up, monitors on throughout procedure. See vital signs in flow sheet. Tolerated Procedure well.  

## 2020-05-22 NOTE — Op Note (Signed)
Orthopaedic Surgery Operative Note (CSN: [id number])  Mitchell Booker  08/27/1981 Date of Surgery: 05/22/2020   Diagnoses:  Left acute ACL injury with medial lateral meniscus injuries  Procedure: Left autograft quadriceps ACL reconstruction Left medial meniscal repair Left partial lateral meniscectomy   Operative Finding Exam under anesthesia: Full motion no limitation, grossly unstable Lockman without endpoint Suprapatellar pouch: Normal Patellofemoral Compartment: Normal Medial Compartment: Cartilage was normal, peripheral vertical tear of the meniscus that was clearly destabilizing.  This was repaired with 3 Arthrex fiber tacks Lateral Compartment: Central radial tear that was only involving about 30% of the width of the meniscus.  This was trimmed back to a stable base, only about 15% of total meniscal volume resected. Intercondylar Notch: ACL is completely torn.  Successful completion of the planned procedure.  Great fixation and good graft 9 mm both femoral and tibial sides.  Standard protocol.  Post-operative plan: The patient will be weightbearing to tolerance in a hinged knee brace locked in extension per standard quadriceps protocol.  The patient will be discharged home.  DVT prophylaxis Aspirin 81 mg twice daily for 6 weeks.  Pain control with PRN pain medication preferring oral medicines.  Follow up plan will be scheduled in approximately 7 days for incision check and XR.  Post-Op Diagnosis: Same Surgeons:Primary: Hiram Gash, MD Assistants:Caroline McBane PA-C Location: [facility] OR ROOM 6 Anesthesia: General with regional block Antibiotics: Ancef 2 g with local vancomycin powder 1 g at the surgical site Tourniquet time:  Total Tourniquet Time Documented: Thigh (Left) - 60 minutes Total: Thigh (Left) - 60 minutes  Estimated Blood Loss: Minima Complications: None Specimens: None Implants: Implant Name Type Inv. Item Serial No. Manufacturer Lot No. LRB No. Used Action   ACL FIBERTAG TIGHTROPE IMPLANT     [id number] Left 1 Implanted  IMPLANT FIBERSTICH 2-0 CVD - [id number] Anchor IMPLANT FIBERSTICH 2-0 CVD  [employer] [id number] Left 1 Implanted  IMPLANT FIBERSTICH 2-0 CVD - [id number] Anchor IMPLANT FIBERSTICH 2-0 CVD  [employer] [id number] Left 1 Implanted  IMPLANT FIBERSTICH 2-0 CVD - [id number] Anchor IMPLANT FIBERSTICH 2-0 CVD  [employer] [id number] Left 1 Implanted  IMPLANT SCREW BIO 8X30 - [id number] Screw IMPLANT SCREW BIO 8X30  [place] [id number] Left 1 Implanted    Indications for Surgery:   Mitchell Booker is a 39 y.o. male with fall at work resulting in a acute traumatic injury to his left knee resulting in an ACL rupture with medial and lateral meniscus pathology.  Benefits and risks of operative and nonoperative management were discussed prior to surgery with patient/guardian(s) and informed consent form was completed.  Specific risks including infection, need for additional surgery, retear, stiffness, meniscal failure of repair amongst others   Procedure:   The patient was identified properly. Informed consent was obtained and the surgical site was marked. The patient was taken up to suite where general anesthesia was induced. The patient was placed in the supine position with a post against the surgical leg and a nonsterile tourniquet applied. The surgical leg was then prepped and draped usual sterile fashion.  A standard surgical timeout was performed.  We began with our quadriceps autograft harvest.  A longitudinal incision was made off the superior pole of the patella.  Went through skin sharply achieving hemostasis we progressed.  Identified the fat pad overlying the distal insertion of the quadriceps tendon.  We resected an area of fat pad to clearly expose the quadriceps tendon and used a Metzenbaum scissor to elevate a  full-thickness layer just off the anterior portion of the quadriceps tendon.  We are able to visualize the quadriceps clearly at this point.  We  then measured a 9 mm graft centered over the quadriceps tendon taking care to note that we get good length.  We freed the 9 mm graft off the patella using a knife sharply.  We then able to whipstitch this area using a FiberWire and use a Arthrex quad pro harvester to perform a minimally invasive quad harvest obtaining 8.2 cm of total length.  We did not violate the capsule.  This was taken to the back table and when stitched formed a 9 mm graft.  We fixed a tight rope to one end of the graft and whipstitched the alternating end.  Once this repaired it was placed on stretch and left on the back table protected.  We began arthroscopy and made our lateral and medial portals in the typical fashion. Fat pad was resected and diagnostic arthroscopy performed with the findings listed above.   We identified the peripheral meniscal tear on the medial side and stimulated it with a shaver before placing 3 Arthrex fiber tacks to hold stable.  With good fixation overall.  Partial lateral meniscectomy was performed with a shaver and a basket back to a stable base.  The anterior cruciate ligament stump was debrided utilizing a shaver taking great care to preserve the remnant stump on the femur and the tibia for localization of our tunnels. Once the remnant anterior cruciate ligament was removed and we obtained appropriate visualization by performing a small notchplasty and confirmed that we had indeed identify the over-the-top position. We made small marks at the location of the aperture of the tibial and femoral tunnels and double checked our location prior to drilling.  We then used a Arthrex tibial guide to ream a 9 mm tunnel from 1.5 cm medial to the tibial tubercle to our mark for the tibial aperture. At this point tunnels cleared of debris and once we verified there were happy with our tunnel we utilized a [employer] guide with 7 mm offset to create our femoral tunnel. A flexible guidepin was placed into  the tibial tunnel and into the guide itself and directed at the footprint of the anterior cruciate ligament. We then ensured that the knee was at 90 of flexion and passed the flexible guidepin out the lateral cortex of the femur and through the skin without issue. We verified that we were in the anterior half of the femur to avoid posterior blowout prior to reaming our 9 mm femoral tunnel to a depth of 25 mm. Flexible reamers used to perform this taking great care to not run on power through the tibial tunnel to avoid moving the tibial tunnel more posterior.  We then from outside in percutaneously placed a 4 mm straight reamer through the lateral femoral cortex to allow the button passage.  We then created our femoral tunnel and cleared it of any bony debris using suction and shaver. We double checked that the posterior wall was intact prior to proceeding with passing the graft. A shuttling stitch was passed and the graft was passed without issue and the graft was shuttled into the knee in the correct orientation.    Femoral fixation was with a tight rope device and we checked its placement on the lateral periosteum with fluoroscopy.  We verified arthroscopically that there is no sign of graft impingement on the notch. We then cycled the  knee multiple times and turned our attention to the tibia.  Tibia was fixed with a 8x 30 mm bio composite Arthrex screw.  There was good purchase of the screw and the screw protrusion thus we felt there is no need to back up this fixation.  At this point a gentle Lachman maneuver was performed and there is a stable endpoint and no translation.  Incision was closed in multilayer fashion with absorbable suture and Steri-Strips placed. Sterile dressing and knee immobilizer were placed and patient taken to PACU without adverse event.    Incisions closed with absorbable suture. The patient was awoken from general anesthesia and taken to the PACU in stable condition without  complication.   Noemi Chapel, PA-C, present and scrubbed throughout the case, critical for completion in a timely fashion, and for retraction, instrumentation, closure.

## 2020-05-22 NOTE — Anesthesia Preprocedure Evaluation (Addendum)
Anesthesia Evaluation  Patient identified by MRN, date of birth, ID band Patient awake    Reviewed: Allergy & Precautions, NPO status , Patient's Chart, lab work & pertinent test results  Airway Mallampati: II  TM Distance: >3 FB Neck ROM: Full    Dental  (+) Dental Advisory Given, Teeth Intact   Pulmonary asthma , former smoker,    Pulmonary exam normal breath sounds clear to auscultation       Cardiovascular hypertension, Normal cardiovascular exam Rhythm:Regular Rate:Normal     Neuro/Psych PSYCHIATRIC DISORDERS negative neurological ROS     GI/Hepatic Neg liver ROS, GERD  ,  Endo/Other  Morbid obesity  Renal/GU negative Renal ROS     Musculoskeletal negative musculoskeletal ROS (+)   Abdominal (+) + obese,   Peds  Hematology negative hematology ROS (+)   Anesthesia Other Findings   Reproductive/Obstetrics negative OB ROS                            Anesthesia Physical Anesthesia Plan  ASA: III  Anesthesia Plan: General   Post-op Pain Management: GA combined w/ Regional for post-op pain   Induction: Intravenous  PONV Risk Score and Plan: 3 and Ondansetron, Dexamethasone, Treatment may vary due to age or medical condition and Midazolam  Airway Management Planned: Oral ETT  Additional Equipment: None  Intra-op Plan:   Post-operative Plan: Extubation in OR  Informed Consent: I have reviewed the patients History and Physical, chart, labs and discussed the procedure including the risks, benefits and alternatives for the proposed anesthesia with the patient or authorized representative who has indicated his/her understanding and acceptance.     Dental advisory given  Plan Discussed with: CRNA  Anesthesia Plan Comments:         Anesthesia Quick Evaluation

## 2020-05-22 NOTE — Anesthesia Procedure Notes (Signed)
Anesthesia Regional Block: Adductor canal block   Pre-Anesthetic Checklist: ,, timeout performed, Correct Patient, Correct Site, Correct Laterality, Correct Procedure, Correct Position, site marked, Risks and benefits discussed,  Surgical consent,  Pre-op evaluation,  At surgeon's request and post-op pain management  Laterality: Left  Prep: chloraprep       Needles:  Injection technique: Single-shot  Needle Type: Stimiplex     Needle Length: 9cm  Needle Gauge: 21     Additional Needles:   Procedures:,,,, ultrasound used (permanent image in chart),,,,  Narrative:  Start time: 05/22/2020 10:45 AM End time: 05/22/2020 10:50 AM Injection made incrementally with aspirations every 5 mL.  Performed by: Personally  Anesthesiologist: Nolon Nations, MD  Additional Notes: BP cuff, EKG monitors applied. Sedation begun. Artery and nerve location verified with U/S and anesthetic injected incrementally, slowly, and after negative aspirations under direct u/s guidance. Good fascial /perineural spread. Tolerated well.

## 2020-05-22 NOTE — Discharge Instructions (Signed)
Regional Anesthesia Blocks  1. Numbness or the inability to move the "blocked" extremity may last from 3-48 hours after placement. The length of time depends on the medication injected and your individual response to the medication. If the numbness is not going away after 48 hours, call your surgeon.  2. The extremity that is blocked will need to be protected until the numbness is gone and the  Strength has returned. Because you cannot feel it, you will need to take extra care to avoid injury. Because it may be weak, you may have difficulty moving it or using it. You may not know what position it is in without looking at it while the block is in effect.  3. For blocks in the legs and feet, returning to weight bearing and walking needs to be done carefully. You will need to wait until the numbness is entirely gone and the strength has returned. You should be able to move your leg and foot normally before you try and bear weight or walk. You will need someone to be with you when you first try to ensure you do not fall and possibly risk injury.  4. Bruising and tenderness at the needle site are common side effects and will resolve in a few days.  5. Persistent numbness or new problems with movement should be communicated to the surgeon or the [facility] [phone] [place] [phone]).

## 2020-05-22 NOTE — Transfer of Care (Signed)
Immediate Anesthesia Transfer of Care Note  Patient: Mitchell Booker  Procedure(s) Performed: KNEE ARTHROSCOPY WITH ANTERIOR CRUCIATE LIGAMENT (ACL) REPAIR (Left Knee) LEFT KNEE ARTHROSCOPY WITH MEDIAL MENISCAL REPAIR (Left Knee)  Patient Location: PACU  Anesthesia Type:GA combined with regional for post-op pain  Level of Consciousness: sedated  Airway & Oxygen Therapy: Patient Spontanous Breathing and Patient connected to face mask oxygen  Post-op Assessment: Report given to RN and Post -op Vital signs reviewed and stable  Post vital signs: Reviewed and stable  Last Vitals:  Vitals Value Taken Time  BP 145/85 05/22/20 1256  Temp    Pulse 86 05/22/20 1258  Resp 22 05/22/20 1259  SpO2 99 % 05/22/20 1258  Vitals shown include unvalidated device data.  Last Pain:  Vitals:   05/22/20 1008  TempSrc: Oral  PainSc: 0-No pain      Patients Stated Pain Goal: 3 (68/12/75 1700)  Complications: No complications documented.

## 2020-05-22 NOTE — Anesthesia Postprocedure Evaluation (Signed)
Anesthesia Post Note  Patient: Mitchell Booker  Procedure(s) Performed: KNEE ARTHROSCOPY WITH ANTERIOR CRUCIATE LIGAMENT (ACL) REPAIR (Left Knee) LEFT KNEE ARTHROSCOPY WITH MEDIAL MENISCAL REPAIR (Left Knee)     Patient location during evaluation: PACU Anesthesia Type: General Level of consciousness: sedated and patient cooperative Pain management: pain level controlled Vital Signs Assessment: post-procedure vital signs reviewed and stable Respiratory status: spontaneous breathing Cardiovascular status: stable Anesthetic complications: no   No complications documented.  Last Vitals:  Vitals:   05/22/20 1342 05/22/20 1411  BP: 120/78 120/78  Pulse:  74  Resp: 18 18  Temp: 36.7 C 36.7 C  SpO2: 97% 97%    Last Pain:  Vitals:   05/22/20 1411  TempSrc:   PainSc: 0-No pain                 Nolon Nations

## 2020-05-22 NOTE — Interval H&P Note (Signed)
All questions answered. Patient like to proceed

## 2020-05-23 ENCOUNTER — Encounter (HOSPITAL_BASED_OUTPATIENT_CLINIC_OR_DEPARTMENT_OTHER): Payer: Self-pay | Admitting: Orthopaedic Surgery

## 2020-07-31 ENCOUNTER — Other Ambulatory Visit: Payer: Self-pay | Admitting: Legal Medicine

## 2020-07-31 DIAGNOSIS — F322 Major depressive disorder, single episode, severe without psychotic features: Secondary | ICD-10-CM

## 2020-08-20 ENCOUNTER — Ambulatory Visit: Payer: BC Managed Care – PPO | Admitting: Legal Medicine

## 2020-12-03 DIAGNOSIS — R509 Fever, unspecified: Secondary | ICD-10-CM | POA: Diagnosis not present

## 2020-12-03 DIAGNOSIS — U071 COVID-19: Secondary | ICD-10-CM | POA: Diagnosis not present

## 2021-01-01 ENCOUNTER — Encounter: Payer: Self-pay | Admitting: Legal Medicine

## 2021-03-09 DIAGNOSIS — Z20828 Contact with and (suspected) exposure to other viral communicable diseases: Secondary | ICD-10-CM | POA: Diagnosis not present

## 2021-03-09 DIAGNOSIS — J111 Influenza due to unidentified influenza virus with other respiratory manifestations: Secondary | ICD-10-CM | POA: Diagnosis not present

## 2021-03-09 DIAGNOSIS — R509 Fever, unspecified: Secondary | ICD-10-CM | POA: Diagnosis not present

## 2021-03-09 DIAGNOSIS — R059 Cough, unspecified: Secondary | ICD-10-CM | POA: Diagnosis not present

## 2021-03-28 ENCOUNTER — Other Ambulatory Visit: Payer: Self-pay | Admitting: Legal Medicine

## 2021-03-28 DIAGNOSIS — F322 Major depressive disorder, single episode, severe without psychotic features: Secondary | ICD-10-CM

## 2022-01-04 ENCOUNTER — Other Ambulatory Visit: Payer: Self-pay | Admitting: Legal Medicine

## 2022-01-04 DIAGNOSIS — F322 Major depressive disorder, single episode, severe without psychotic features: Secondary | ICD-10-CM

## 2022-01-08 ENCOUNTER — Ambulatory Visit: Payer: Managed Care, Other (non HMO) | Admitting: Family Medicine

## 2022-01-08 ENCOUNTER — Other Ambulatory Visit: Payer: Self-pay

## 2022-01-08 VITALS — BP 110/72 | HR 70 | Temp 97.3°F | Resp 18 | Ht 71.0 in | Wt 328.0 lb

## 2022-01-08 DIAGNOSIS — F331 Major depressive disorder, recurrent, moderate: Secondary | ICD-10-CM

## 2022-01-08 DIAGNOSIS — F902 Attention-deficit hyperactivity disorder, combined type: Secondary | ICD-10-CM

## 2022-01-08 DIAGNOSIS — E782 Mixed hyperlipidemia: Secondary | ICD-10-CM | POA: Diagnosis not present

## 2022-01-08 DIAGNOSIS — R0789 Other chest pain: Secondary | ICD-10-CM

## 2022-01-08 DIAGNOSIS — F411 Generalized anxiety disorder: Secondary | ICD-10-CM | POA: Diagnosis not present

## 2022-01-08 DIAGNOSIS — R6889 Other general symptoms and signs: Secondary | ICD-10-CM

## 2022-01-08 DIAGNOSIS — F410 Panic disorder [episodic paroxysmal anxiety] without agoraphobia: Secondary | ICD-10-CM | POA: Diagnosis not present

## 2022-01-08 NOTE — Progress Notes (Signed)
Subjective:  Patient ID: Mitchell Booker, male    DOB: September 20, 1981  Age: 41 y.o. MRN: [id number]  Chief Complaint  Patient presents with   Depression   Panic Attack   Anxiety    HPI Depression and anxiety: Patient has had depression, anxiety x 1 yr. Has been on zoloft 50 mg  x 1 year approximately. Patient ran out a few weeks ago and opted to stop it for 3 weeks. His wife noted a significant increase in anxiety and irritability. He restarted on zoloft 50 mg daily 3 weeks ago. Has not improved in the last 3 weeks. Patient is having issues sleeping. He awakens multiple times through the night. Patient has history of ADHD, but has not been on medicine for years. Tried on adderal previously, but did not help.  Having panic attacks 2-3 times per week. Racing thoughts, chest pain, shortness of breath. No hyperventilating, nausea and vomiting. Xanax helps panic attacks.  About 3-4 yrs ago patient had cardiac work up which was negative. This was at [facility].   Patient has had suicidal thoughts, but promises he would not go through with it.   PHQ9 SCORE ONLY 01/11/2022 05/16/2020 03/05/2020  PHQ-9 Total Score 17 8 13    GAD 7 : Generalized Anxiety Score 01/08/2022 05/17/2020  Nervous, Anxious, on Edge 3 1  Control/stop worrying 0 1  Worry too much - different things 0 0  Trouble relaxing 1 1  Restless 1 0  Easily annoyed or irritable 1 1  Afraid - awful might happen 0 0  Total GAD 7 Score 6 4  Anxiety Difficulty Somewhat difficult Somewhat difficult    Mood Disorder: Patient reports that there has been times during which he felt so good or hyper that other people check noted, so irritable that he lost his temper, was more talkative and spoke quickly, had racing thoughts in his head as if he could not slow his mind down, was easily distracted by things around him resulting in difficulty concentrating, had increased energy, did things that were excessively foolish or risky.  He does not report  that he feels more self-confident than usual, he does not report getting less sleep than usual and not feeling like he missed it, does not report periods of increased activity, periods of increased social and outgoing behavior, increased interest in sex more than usual, or spending money that got he or his family in trouble.  He reports this is close to goal problems within his life.  Denies any relatives who have bipolar nor does he have a history of diagnosis of bipolar. Current Outpatient Medications on File Prior to Visit  Medication Sig Dispense Refill   ALPRAZolam (NIRAVAM) 0.5 MG dissolvable tablet Take 1 tablet (0.5 mg total) by mouth 2 (two) times daily as needed for anxiety. 20 tablet 0   cetirizine (ZYRTEC) 10 MG tablet Take 10 mg by mouth daily.     docusate sodium (COLACE) 100 MG capsule Take 200 mg by mouth daily.     polyethylene glycol (MIRALAX / GLYCOLAX) 17 g packet Take 34 g by mouth daily.     sertraline (ZOLOFT) 50 MG tablet TAKE 1 TABLET(50 MG) BY MOUTH DAILY 30 tablet 6   No current facility-administered medications on file prior to visit.   Past Medical History:  Diagnosis Date   Anxiety    Essential hypertension 02/20/2020   GERD (gastroesophageal reflux disease)    Mixed hyperlipidemia 02/20/2020   Past Surgical History:  Procedure Laterality  Date   KNEE ARTHROSCOPY WITH ANTERIOR CRUCIATE LIGAMENT (ACL) REPAIR Left 05/22/2020   Procedure: KNEE ARTHROSCOPY WITH ANTERIOR CRUCIATE LIGAMENT (ACL) REPAIR;  Surgeon: Hiram Gash, MD;  Location: [facility];  Service: Orthopedics;  Laterality: Left;   KNEE ARTHROSCOPY WITH MENISCAL REPAIR Left 05/22/2020   Procedure: LEFT KNEE ARTHROSCOPY WITH MEDIAL MENISCAL REPAIR;  Surgeon: Hiram Gash, MD;  Location: [facility];  Service: Orthopedics;  Laterality: Left;   wisdom teeth      Family History  Problem Relation Age of Onset   Heart disease Father    Emphysema Father    COPD Father    Heart  attack Father    Diabetes Maternal Grandfather    Osteoporosis Mother    Colon cancer Neg Hx    Colon polyps Neg Hx    Esophageal cancer Neg Hx    Prostate cancer Neg Hx    Rectal cancer Neg Hx    Social History   Socioeconomic History   Marital status: Married    Spouse name: Not on file   Number of children: 1   Years of education: Not on file   Highest education level: Not on file  Occupational History   Occupation: General labor    Employer: [employer]  Tobacco Use   Smoking status: Former    Packs/day: 3.00    Years: 4.00    Pack years: 12.00    Types: Cigarettes    Quit date: 2003    Years since quitting: 20.1   Smokeless tobacco: Current    Types: Chew   Tobacco comments:    1 can a day  Vaping Use   Vaping Use: Never used  Substance and Sexual Activity   Alcohol use: Not Currently   Drug use: Never   Sexual activity: Yes    Partners: Female  Other Topics Concern   Not on file  Social History Narrative   Not on file   Social Determinants of Health   Financial Resource Strain: Not on file  Food Insecurity: Not on file  Transportation Needs: Not on file  Physical Activity: Not on file  Stress: Not on file  Social Connections: Not on file    Review of Systems  Constitutional:  Positive for fatigue and unexpected weight change. Negative for chills and fever.  HENT:  Negative for congestion, rhinorrhea and sore throat.   Respiratory:  Positive for shortness of breath. Negative for cough.   Cardiovascular:  Positive for chest pain. Negative for palpitations.  Gastrointestinal:  Negative for abdominal pain, constipation, diarrhea, nausea and vomiting.  Genitourinary:  Negative for dysuria and urgency.  Musculoskeletal:  Positive for back pain and myalgias. Negative for arthralgias.  Neurological:  Negative for dizziness and headaches.  Psychiatric/Behavioral:  Positive for agitation, decreased concentration, dysphoric mood, sleep disturbance  (Awakens a couple times through the night. Gets about 6 hours.) and suicidal ideas. The patient is nervous/anxious and is hyperactive.     Objective:  BP 110/72    Pulse 70    Temp (!) 97.3 F (36.3 C)    Resp 18    Ht 5\' 11"  (1.803 m)    Wt (!) 328 lb (148.8 kg)    BMI 45.75 kg/m   BP/Weight 01/08/2022 05/22/2020 2/77/4128  Systolic BP 786 767 209  Diastolic BP 72 78 90  Wt. (Lbs) 328 310.85 318  BMI 45.75 43.35 44.35    Physical Exam Vitals reviewed.  Constitutional:  Appearance: Normal appearance. He is obese.  Neck:     Vascular: No carotid bruit.  Cardiovascular:     Rate and Rhythm: Normal rate and regular rhythm.     Pulses: Normal pulses.     Heart sounds: Normal heart sounds.  Pulmonary:     Effort: Pulmonary effort is normal.     Breath sounds: Normal breath sounds. No wheezing, rhonchi or rales.  Abdominal:     General: Bowel sounds are normal.     Palpations: Abdomen is soft.     Tenderness: There is no abdominal tenderness.  Neurological:     Mental Status: He is alert and oriented to person, place, and time.  Psychiatric:        Behavior: Behavior normal.     Comments: Irritable, anxious. depressed    Diabetic Foot Exam - Simple   No data filed      Lab Results  Component Value Date   WBC 9.4 01/08/2022   HGB 15.5 01/08/2022   HCT 46.5 01/08/2022   PLT 266 01/08/2022   GLUCOSE 94 01/08/2022   CHOL 163 01/08/2022   TRIG 142 01/08/2022   HDL 37 (L) 01/08/2022   LDLCALC 101 (H) 01/08/2022   ALT 41 01/08/2022   AST 31 01/08/2022   NA 141 01/08/2022   K 4.2 01/08/2022   CL 102 01/08/2022   CREATININE 1.33 (H) 01/08/2022   BUN 17 01/08/2022   CO2 22 01/08/2022   TSH 3.120 01/08/2022      Assessment & Plan:   Problem List Items Addressed This Visit       Other   Mixed hyperlipidemia    Well controlled.  No changes to medicines.  Continue to work on eating a healthy diet and exercise.  Labs drawn today.        Relevant Orders    Comprehensive metabolic panel (Completed)   Lipid panel (Completed)   Chest pain - Primary    Ekg: NS. No st changes.       Relevant Orders   EKG 12-Lead (Completed)   GAD (generalized anxiety disorder)    Check labs.  Start on vraylar, continue zoloft 50 mg daily        Panic attacks    Check labs.  Start on vraylar, continue zoloft 50 mg daily       Relevant Orders   CBC with Differential/Platelet (Completed)   Unintentional weight change    Increased weight.  Check olp.  Check tsh      Relevant Orders   TSH (Completed)   Depression, major, recurrent, moderate ([place])    Possible bipolar vs adhd Start on vraylar 1.5 g weekly.  Continue zoloft.       Other Visit Diagnoses     Attention deficit hyperactivity disorder (ADHD), combined type         .  No orders of the defined types were placed in this encounter.   Orders Placed This Encounter  Procedures   CBC with Differential/Platelet   Comprehensive metabolic panel   Lipid panel   TSH   Cardiovascular Risk Assessment   EKG 12-Lead     Follow-up: No follow-ups on file.  An After Visit Summary was printed and given to the patient.  Rochel Brome, MD [facility] [phone]

## 2022-01-10 LAB — LIPID PANEL
Chol/HDL Ratio: 4.4 ratio (ref 0.0–5.0)
Cholesterol, Total: 163 mg/dL (ref 100–199)
HDL: 37 mg/dL — ABNORMAL LOW (ref >39)
LDL Chol Calc (NIH): 101 mg/dL — ABNORMAL HIGH (ref 0–99)
Triglycerides: 142 mg/dL (ref 0–149)
VLDL Cholesterol Cal: 25 mg/dL (ref 5–40)

## 2022-01-10 LAB — COMPREHENSIVE METABOLIC PANEL
ALT: 41 IU/L (ref 0–44)
AST: 31 IU/L (ref 0–40)
Albumin/Globulin Ratio: 1.6 (ref 1.2–2.2)
Albumin: 4.5 g/dL (ref 4.0–5.0)
Alkaline Phosphatase: 101 IU/L (ref 44–121)
BUN/Creatinine Ratio: 13 (ref 9–20)
BUN: 17 mg/dL (ref 6–20)
Bilirubin Total: 0.6 mg/dL (ref 0.0–1.2)
CO2: 22 mmol/L (ref 20–29)
Calcium: 9.5 mg/dL (ref 8.7–10.2)
Chloride: 102 mmol/L (ref 96–106)
Creatinine, Ser: 1.33 mg/dL — ABNORMAL HIGH (ref 0.76–1.27)
Globulin, Total: 2.8 g/dL (ref 1.5–4.5)
Glucose: 94 mg/dL (ref 70–99)
Potassium: 4.2 mmol/L (ref 3.5–5.2)
Sodium: 141 mmol/L (ref 134–144)
Total Protein: 7.3 g/dL (ref 6.0–8.5)
eGFR: 70 mL/min/{1.73_m2} (ref >59)

## 2022-01-10 LAB — TSH: TSH: 3.12 u[IU]/mL (ref 0.450–4.500)

## 2022-01-10 LAB — CBC WITH DIFFERENTIAL/PLATELET
Basophils Absolute: 0.1 10*3/uL (ref 0.0–0.2)
Basos: 1 %
EOS (ABSOLUTE): 0.1 10*3/uL (ref 0.0–0.4)
Eos: 1 %
Hematocrit: 46.5 % (ref 37.5–51.0)
Hemoglobin: 15.5 g/dL (ref 13.0–17.7)
Immature Grans (Abs): 0 10*3/uL (ref 0.0–0.1)
Immature Granulocytes: 0 %
Lymphocytes Absolute: 3.1 10*3/uL (ref 0.7–3.1)
Lymphs: 33 %
MCH: 29.1 pg (ref 26.6–33.0)
MCHC: 33.3 g/dL (ref 31.5–35.7)
MCV: 87 fL (ref 79–97)
Monocytes Absolute: 0.6 10*3/uL (ref 0.1–0.9)
Monocytes: 7 %
Neutrophils Absolute: 5.4 10*3/uL (ref 1.4–7.0)
Neutrophils: 58 %
Platelets: 266 10*3/uL (ref 150–450)
RBC: 5.32 x10E6/uL (ref 4.14–5.80)
RDW: 13.5 % (ref 11.6–15.4)
WBC: 9.4 10*3/uL (ref 3.4–10.8)

## 2022-01-10 LAB — CARDIOVASCULAR RISK ASSESSMENT

## 2022-01-11 ENCOUNTER — Encounter: Payer: Self-pay | Admitting: Family Medicine

## 2022-01-11 DIAGNOSIS — F411 Generalized anxiety disorder: Secondary | ICD-10-CM | POA: Insufficient documentation

## 2022-01-11 DIAGNOSIS — F410 Panic disorder [episodic paroxysmal anxiety] without agoraphobia: Secondary | ICD-10-CM | POA: Insufficient documentation

## 2022-01-11 DIAGNOSIS — F331 Major depressive disorder, recurrent, moderate: Secondary | ICD-10-CM | POA: Insufficient documentation

## 2022-01-11 DIAGNOSIS — R6889 Other general symptoms and signs: Secondary | ICD-10-CM | POA: Insufficient documentation

## 2022-01-11 NOTE — Assessment & Plan Note (Signed)
Ekg: NS. No st changes.

## 2022-01-11 NOTE — Assessment & Plan Note (Addendum)
Possible bipolar vs adhd Start on vraylar 1.5 g weekly.  Continue zoloft.

## 2022-01-11 NOTE — Assessment & Plan Note (Signed)
Check labs.  Start on vraylar, continue zoloft 50 mg daily

## 2022-01-11 NOTE — Assessment & Plan Note (Signed)
Increased weight.  Check olp.  Check tsh

## 2022-01-11 NOTE — Assessment & Plan Note (Addendum)
Check labs.  Start on vraylar, continue zoloft 50 mg daily

## 2022-01-11 NOTE — Assessment & Plan Note (Signed)
Well controlled.  ?No changes to medicines.  ?Continue to work on eating a healthy diet and exercise.  ?Labs drawn today.  ?

## 2022-01-30 ENCOUNTER — Ambulatory Visit: Payer: Managed Care, Other (non HMO) | Admitting: Family Medicine

## 2022-02-03 ENCOUNTER — Other Ambulatory Visit: Payer: Self-pay

## 2022-02-03 ENCOUNTER — Ambulatory Visit: Payer: Managed Care, Other (non HMO) | Admitting: Family Medicine

## 2022-02-03 ENCOUNTER — Encounter: Payer: Self-pay | Admitting: Family Medicine

## 2022-02-03 VITALS — BP 124/72 | HR 84 | Temp 97.1°F | Resp 18 | Ht 71.0 in | Wt 329.0 lb

## 2022-02-03 DIAGNOSIS — R1032 Left lower quadrant pain: Secondary | ICD-10-CM | POA: Insufficient documentation

## 2022-02-03 DIAGNOSIS — F410 Panic disorder [episodic paroxysmal anxiety] without agoraphobia: Secondary | ICD-10-CM | POA: Diagnosis not present

## 2022-02-03 DIAGNOSIS — F3181 Bipolar II disorder: Secondary | ICD-10-CM

## 2022-02-03 MED ORDER — CARIPRAZINE HCL 1.5 MG PO CAPS: 1.5000 mg | ORAL_CAPSULE | Freq: Every day | ORAL | 1 refills | Status: DC

## 2022-02-03 MED ORDER — ALPRAZOLAM 0.5 MG PO TBDP: 0.5000 mg | ORAL_TABLET | Freq: Every day | ORAL | 0 refills | Status: DC | PRN

## 2022-02-03 MED ORDER — SERTRALINE HCL 50 MG PO TABS: ORAL_TABLET | ORAL | 1 refills | Status: DC

## 2022-02-03 NOTE — Assessment & Plan Note (Signed)
Has done excellent on vraylar 1.5 mg once daily and zoloft 50 mg daily.  ?Samples of vraylar given.  ?Refills of both sent.  ?

## 2022-02-03 NOTE — Assessment & Plan Note (Signed)
Change xanax to 0.5 mg daily as needed only.  ?

## 2022-02-03 NOTE — Progress Notes (Signed)
? ?Subjective:  ?Patient ID: Mitchell Booker, male    DOB: August 12, 1981  Age: 41 y.o. MRN: [id number] ? ?Chief Complaint  ?Patient presents with  ? Depression  ? ? ?HPI ?Bipolar, depression and anxiety. Panic attacks.  ?Patient has been taking xanax once at night scheduled rather than as needed as I preferred.   ?On vraylar 1.5 mg once daily and zoloft 50 mg once daily.  ?Doing much better. Denies panic attacks.  ?Patient has not been on any other medicines.  ? ?PHQ9 SCORE ONLY 02/03/2022 01/11/2022 05/16/2020  ?PHQ-9 Total Score '5 17 8  '$ ? ?GAD 7 : Generalized Anxiety Score 02/03/2022 01/08/2022 05/17/2020  ?Nervous, Anxious, on Edge '1 3 1  '$ ?Control/stop worrying 0 0 1  ?Worry too much - different things 0 0 0  ?Trouble relaxing '1 1 1  '$ ?Restless 1 1 0  ?Easily annoyed or irritable 0 1 1  ?Afraid - awful might happen 0 0 0  ?Total GAD 7 Score '3 6 4  '$ ?Anxiety Difficulty Not difficult at all Somewhat difficult Somewhat difficult  ? ?  ?Current Outpatient Medications on File Prior to Visit  ?Medication Sig Dispense Refill  ? cetirizine (ZYRTEC) 10 MG tablet Take 10 mg by mouth daily.    ? docusate sodium (COLACE) 100 MG capsule Take 200 mg by mouth daily.    ? polyethylene glycol (MIRALAX / GLYCOLAX) 17 g packet Take 34 g by mouth daily.    ? ?No current facility-administered medications on file prior to visit.  ? ?Past Medical History:  ?Diagnosis Date  ? Anxiety   ? Essential hypertension 02/20/2020  ? GERD (gastroesophageal reflux disease)   ? Mixed hyperlipidemia 02/20/2020  ? ?Past Surgical History:  ?Procedure Laterality Date  ? KNEE ARTHROSCOPY WITH ANTERIOR CRUCIATE LIGAMENT (ACL) REPAIR Left 05/22/2020  ? Procedure: KNEE ARTHROSCOPY WITH ANTERIOR CRUCIATE LIGAMENT (ACL) REPAIR;  Surgeon: Hiram Gash, MD;  Location: [facility];  Service: Orthopedics;  Laterality: Left;  ? KNEE ARTHROSCOPY WITH MENISCAL REPAIR Left 05/22/2020  ? Procedure: LEFT KNEE ARTHROSCOPY WITH MEDIAL MENISCAL REPAIR;  Surgeon: Hiram Gash,  MD;  Location: [facility];  Service: Orthopedics;  Laterality: Left;  ? wisdom teeth    ?  ?Family History  ?Problem Relation Age of Onset  ? Heart disease Father   ? Emphysema Father   ? COPD Father   ? Heart attack Father   ? Diabetes Maternal Grandfather   ? Osteoporosis Mother   ? Colon cancer Neg Hx   ? Colon polyps Neg Hx   ? Esophageal cancer Neg Hx   ? Prostate cancer Neg Hx   ? Rectal cancer Neg Hx   ? ?Social History  ? ?Socioeconomic History  ? Marital status: Married  ?  Spouse name: Not on file  ? Number of children: 1  ? Years of education: Not on file  ? Highest education level: Not on file  ?Occupational History  ? Occupation: General labor  ?  Employer: [employer]  ?Tobacco Use  ? Smoking status: Former  ?  Packs/day: 3.00  ?  Years: 4.00  ?  Pack years: 12.00  ?  Types: Cigarettes  ?  Quit date: 2003  ?  Years since quitting: 20.1  ? Smokeless tobacco: Current  ?  Types: Chew  ? Tobacco comments:  ?  1 can a day  ?Vaping Use  ? Vaping Use: Never used  ?Substance and Sexual Activity  ? Alcohol  use: Not Currently  ? Drug use: Never  ? Sexual activity: Yes  ?  Partners: Female  ?Other Topics Concern  ? Not on file  ?Social History Narrative  ? Not on file  ? ?Social Determinants of Health  ? ?Financial Resource Strain: Not on file  ?Food Insecurity: Not on file  ?Transportation Needs: Not on file  ?Physical Activity: Not on file  ?Stress: Not on file  ?Social Connections: Not on file  ? ? ?Review of Systems  ?Constitutional:  Negative for chills and fever.  ?HENT:  Negative for congestion, rhinorrhea and sore throat.   ?Respiratory:  Negative for cough and shortness of breath.   ?Cardiovascular:  Negative for chest pain and palpitations.  ?Gastrointestinal:  Positive for abdominal pain (left-sided (sharp stabbing pain). Not relieved with BMs or Gas. Lasts 5- 10 secs. Happens approximately 6 times per week. Nothing alleviates it. On stool softerner/miralax. So no hard BMs.).  Negative for constipation, diarrhea, nausea and vomiting.  ?Genitourinary:  Negative for dysuria and urgency.  ?Musculoskeletal:  Negative for arthralgias, back pain and myalgias.  ?Neurological:  Negative for dizziness and headaches.  ?Psychiatric/Behavioral:  Positive for dysphoric mood. The patient is nervous/anxious.   ? ? ?Objective:  ?BP 124/72   Pulse 84   Temp (!) 97.1 ?F (36.2 ?C)   Resp 18   Ht '5\' 11"'$  (1.803 m)   Wt (!) 329 lb (149.2 kg)   BMI 45.89 kg/m?  ? ?BP/Weight 02/03/2022 01/08/2022 05/22/2020  ?Systolic BP 161 096 045  ?Diastolic BP 72 72 78  ?Wt. (Lbs) 329 328 310.85  ?BMI 45.89 45.75 43.35  ? ? ?Physical Exam ?Vitals reviewed.  ?Constitutional:   ?   Appearance: Normal appearance.  ?Neck:  ?   Vascular: No carotid bruit.  ?Cardiovascular:  ?   Rate and Rhythm: Normal rate and regular rhythm.  ?   Heart sounds: Normal heart sounds.  ?Pulmonary:  ?   Effort: Pulmonary effort is normal.  ?   Breath sounds: Normal breath sounds. No wheezing, rhonchi or rales.  ?Abdominal:  ?   General: Bowel sounds are normal.  ?   Palpations: Abdomen is soft.  ?   Tenderness: There is no abdominal tenderness.  ?Neurological:  ?   Mental Status: He is alert.  ?Psychiatric:     ?   Mood and Affect: Mood normal.     ?   Behavior: Behavior normal.  ? ? ?Diabetic Foot Exam - Simple   ?No data filed ?  ?  ? ?Lab Results  ?Component Value Date  ? WBC 9.4 01/08/2022  ? HGB 15.5 01/08/2022  ? HCT 46.5 01/08/2022  ? PLT 266 01/08/2022  ? GLUCOSE 94 01/08/2022  ? CHOL 163 01/08/2022  ? TRIG 142 01/08/2022  ? HDL 37 (L) 01/08/2022  ? LDLCALC 101 (H) 01/08/2022  ? ALT 41 01/08/2022  ? AST 31 01/08/2022  ? NA 141 01/08/2022  ? K 4.2 01/08/2022  ? CL 102 01/08/2022  ? CREATININE 1.33 (H) 01/08/2022  ? BUN 17 01/08/2022  ? CO2 22 01/08/2022  ? TSH 3.120 01/08/2022  ? ? ? ? ?Assessment & Plan:  ? ?Problem List Items Addressed This Visit   ? ?  ? Other  ? Mild depressed bipolar I disorder ([place])  ?  Has done excellent on vraylar 1.5  mg once daily and zoloft 50 mg daily.  ?Samples of vraylar given.  ?Refills of both sent.  ?  ?  ?  Relevant Medications  ? sertraline (ZOLOFT) 50 MG tablet  ? ALPRAZolam (NIRAVAM) 0.5 MG dissolvable tablet  ? Panic attacks  ?  Change xanax to 0.5 mg daily as needed only.  ?  ?  ? Relevant Medications  ? sertraline (ZOLOFT) 50 MG tablet  ? ALPRAZolam (NIRAVAM) 0.5 MG dissolvable tablet  ? LLQ abdominal pain - Primary  ?  Labs normal.  ?Refer to gi.  ?  ?  ? Relevant Orders  ? Ambulatory referral to Gastroenterology  ?. ? ?Meds ordered this encounter  ?Medications  ? cariprazine (VRAYLAR) 1.5 MG capsule  ?  Sig: Take 1 capsule (1.5 mg total) by mouth daily.  ?  Dispense:  90 capsule  ?  Refill:  1  ? sertraline (ZOLOFT) 50 MG tablet  ?  Sig: TAKE 1 TABLET(50 MG) BY MOUTH DAILY  ?  Dispense:  90 tablet  ?  Refill:  1  ? ALPRAZolam (NIRAVAM) 0.5 MG dissolvable tablet  ?  Sig: Take 1 tablet (0.5 mg total) by mouth daily as needed for anxiety.  ?  Dispense:  30 tablet  ?  Refill:  0  ? ? ?Orders Placed This Encounter  ?Procedures  ? Ambulatory referral to Gastroenterology  ?  ? ?Follow-up: Return in about 6 months (around 08/06/2022) for chronic follow up. ? ?An After Visit Summary was printed and given to the patient. ? ?Rochel Brome, MD ?[place] ?[phone]

## 2022-02-03 NOTE — Assessment & Plan Note (Signed)
Labs normal.  ?Refer to gi.  ?

## 2022-04-05 IMAGING — MR MR KNEE*L* W/O CM
4 of 7 series · 22 of 40 positions shown · non-contrast
Comparison: Plain films left knee 03/15/2020 from [REDACTED].

CLINICAL DATA: Left knee pain and instability since a twisting
injury when the patient jumped out of his truck a few weeks ago.

EXAM:
MRI OF THE LEFT KNEE WITHOUT CONTRAST
TECHNIQUE: Multiplanar, multisequence MR imaging of the knee was performed. No
intravenous contrast was administered.

[Series 3: T2 fat-sat · axial · 4.0mm · 0.50mm/px · z∈[-65,+60]mm · 6 of 26 slices shown]
[im 1/26]
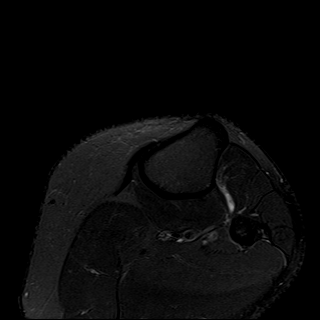
[im 6/26]
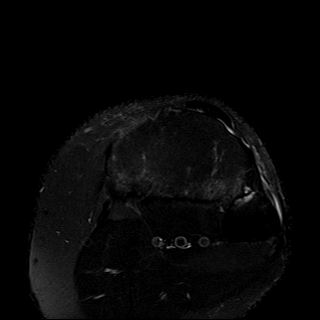
[im 11/26]
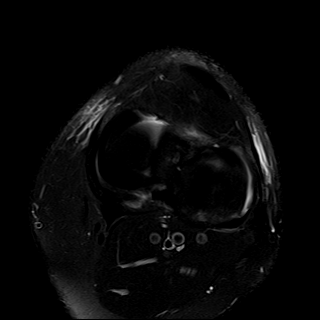
[im 16/26]
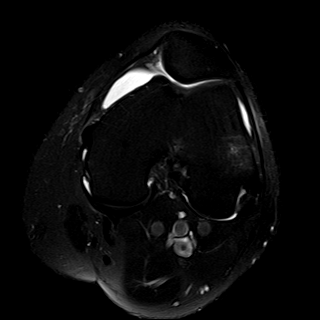
[im 21/26]
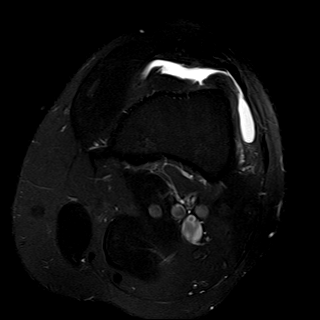
[im 26/26]
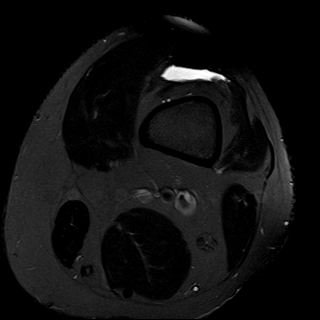

[Series 7: PD fat-sat · sagittal · 3.0mm · 0.29mm/px · 6 of 27 slices shown (1 of 3)]
[im 1/27]
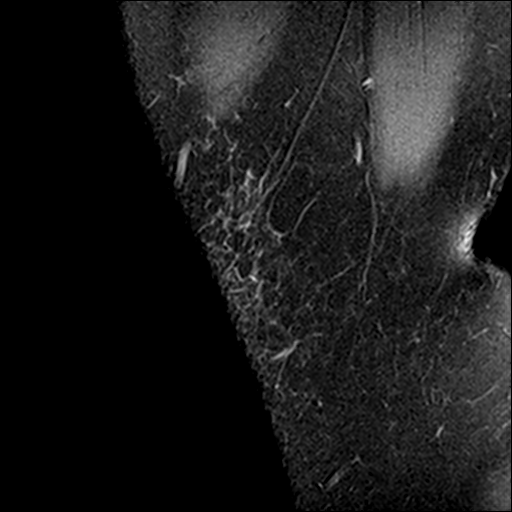
[im 6/27]
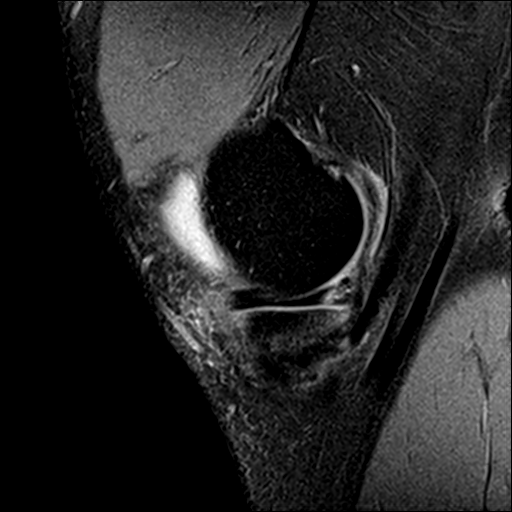
[im 11/27]
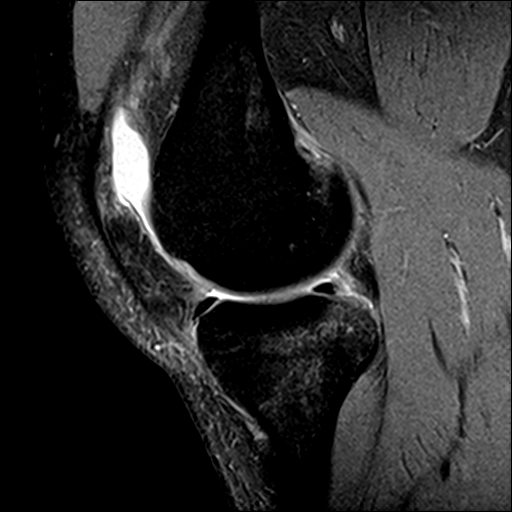
[im 16/27]
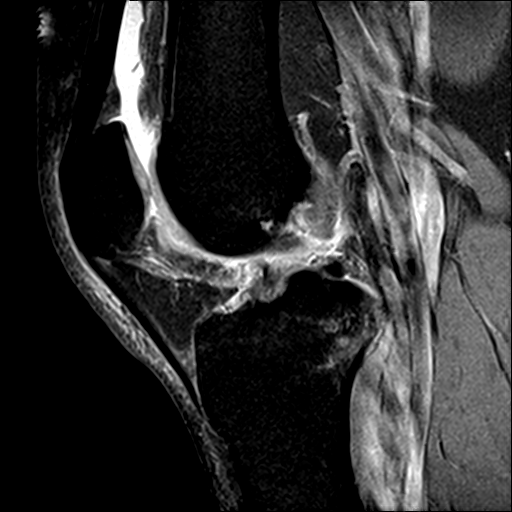
[im 21/27]
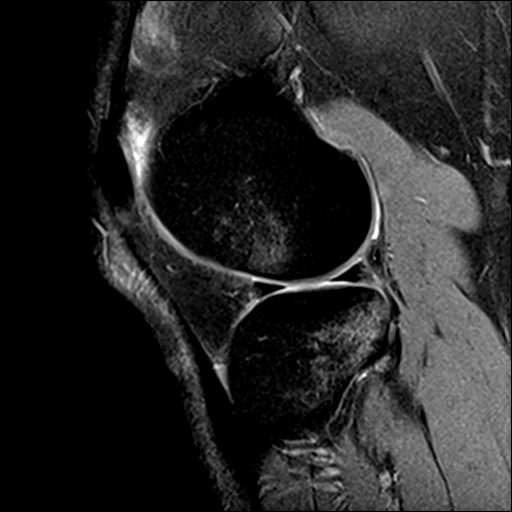
[im 27/27]
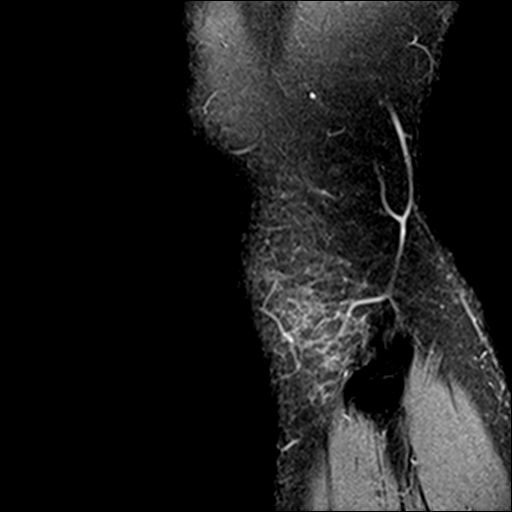

[Series 8: PD fat-sat · coronal · 3.0mm · 0.29mm/px · 7 of 30 slices shown (2 of 3)]
[im 1/30]
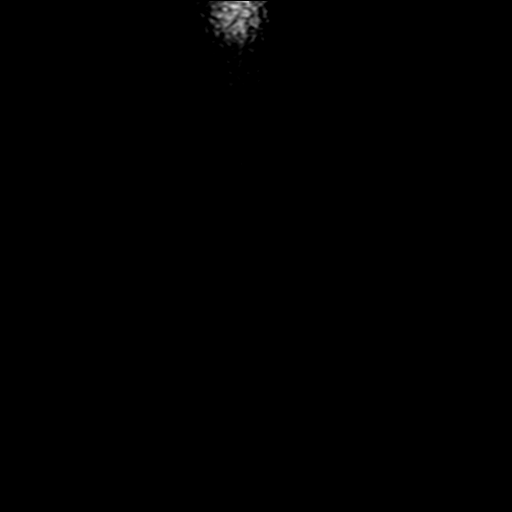
[im 5/30]
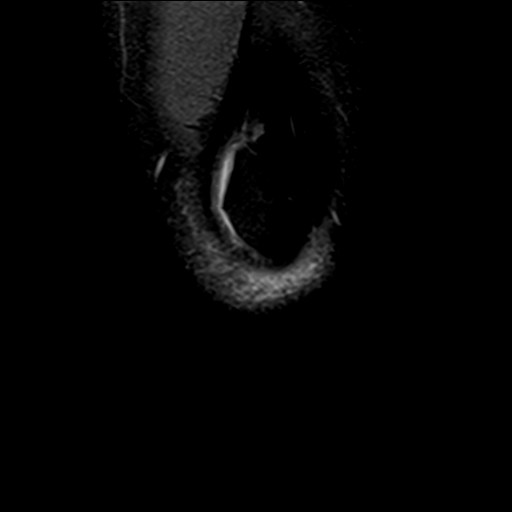
[im 10/30]
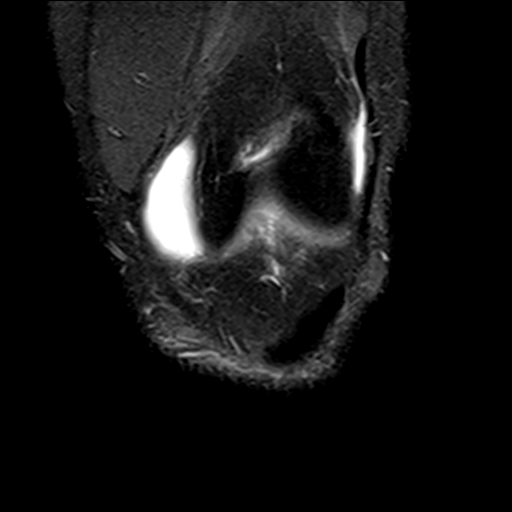
[im 15/30]
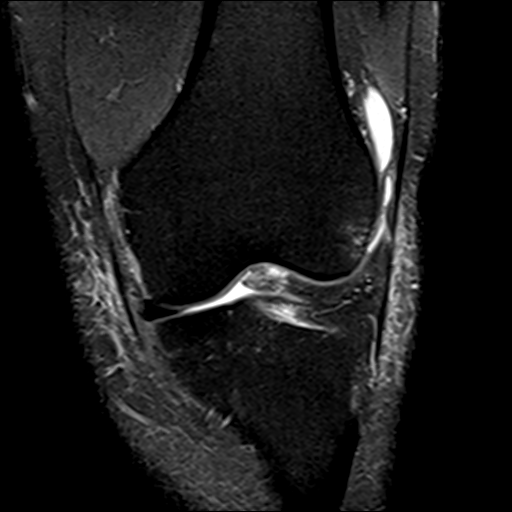
[im 20/30]
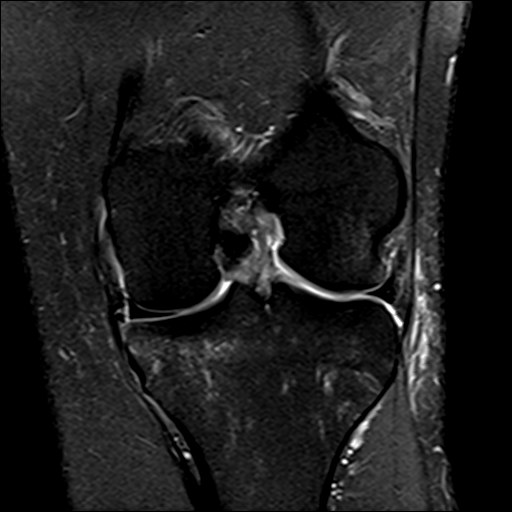
[im 25/30]
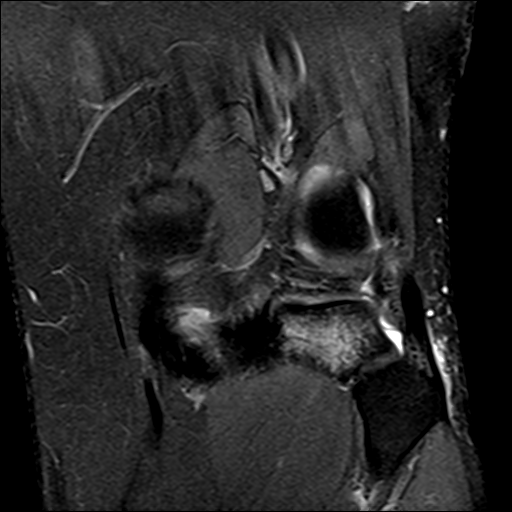
[im 30/30]
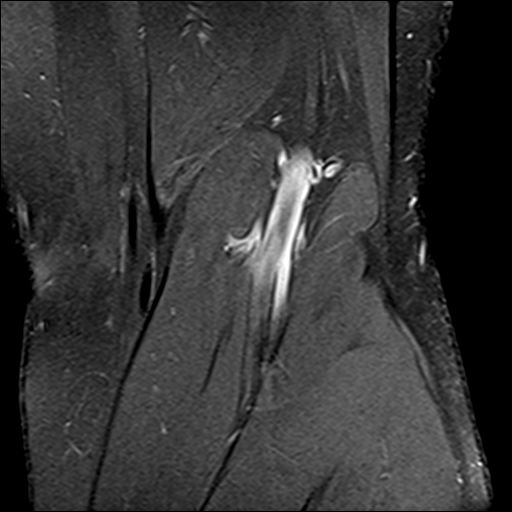

[Series 9: PD fat-sat · oblique · 2.0mm · 0.29mm/px · 3 of 13 slices shown (3 of 3)]
[im 1/13]
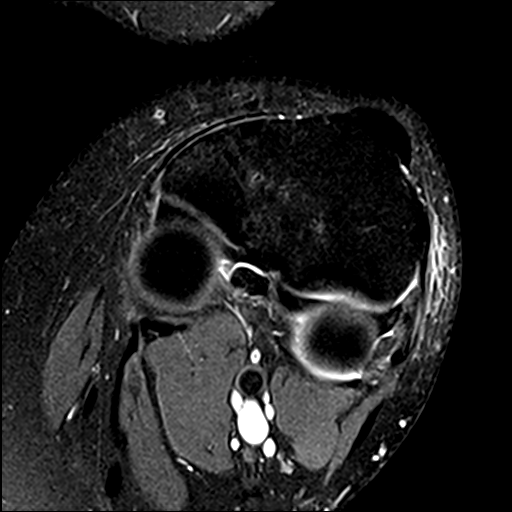
[im 7/13]
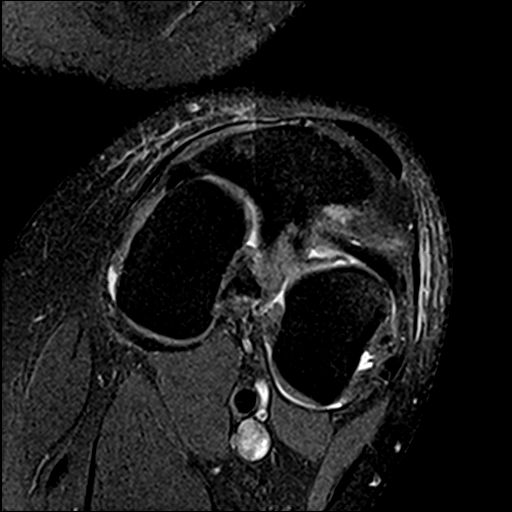
[im 13/13]
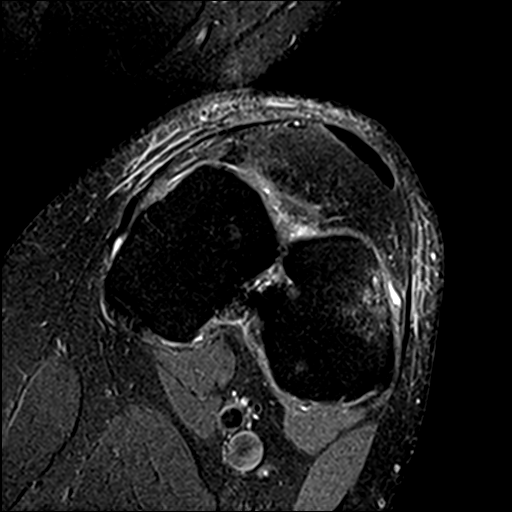

[22 of 40 positions shown; findings below may reference images not displayed]

FINDINGS: MENISCI

Medial meniscus: Complex tear in the posterior horn includes both
longitudinal and horizontal components. There is a horizontal tear
throughout the periphery of the body.

Lateral meniscus: A focal radial tear is seen along the free edge of
the posterior body.

LIGAMENTS

Cruciates:  The ACL is completely torn.  The PCL is intact.

Collaterals:  Intact.

CARTILAGE

Patellofemoral:  Normal.

Medial:  Normal.

Lateral:  Normal.

Joint:  Small to moderate joint effusion.

Popliteal Fossa:  No Baker's cyst.

Extensor Mechanism:  Intact.

Bones: Bone contusions are seen in the posterior aspect of the tibia
and anterior weight-bearing lateral femoral condyle. No fracture.

Other: None.
IMPRESSION: Complete ACL tear with associated bone contusions and joint
effusion.

Complex tearing posterior horn and body of the medial meniscus as
described above.

Focal radial tear along the free edge of the lateral meniscus at the
junction of the posterior horn and body.

## 2022-04-16 ENCOUNTER — Ambulatory Visit: Payer: Managed Care, Other (non HMO) | Admitting: Family Medicine

## 2022-04-16 VITALS — BP 138/86 | HR 88 | Temp 97.8°F | Resp 18 | Ht 71.0 in | Wt 337.0 lb

## 2022-04-16 DIAGNOSIS — R0681 Apnea, not elsewhere classified: Secondary | ICD-10-CM | POA: Diagnosis not present

## 2022-04-16 DIAGNOSIS — G4733 Obstructive sleep apnea (adult) (pediatric): Secondary | ICD-10-CM | POA: Insufficient documentation

## 2022-04-16 DIAGNOSIS — R7989 Other specified abnormal findings of blood chemistry: Secondary | ICD-10-CM | POA: Diagnosis not present

## 2022-04-16 DIAGNOSIS — R6882 Decreased libido: Secondary | ICD-10-CM

## 2022-04-16 DIAGNOSIS — F3181 Bipolar II disorder: Secondary | ICD-10-CM | POA: Diagnosis not present

## 2022-04-16 NOTE — Progress Notes (Signed)
Subjective:  Patient ID: Mitchell Booker, male    DOB: 02/19/1981  Age: 41 y.o. MRN: [id number]  Chief Complaint  Patient presents with   Fatigue    HPI Wife is concerned he has sleep apnea. He snores and stops breathing in the night. Sleepy all day long if not working.  Mild depressed bipolar 2 disorder (HCC) - on vraylar 1.5 mg daily and zoloft 50 mg daily. ON xanax 0.5 mg daily as needed.  Patient is not having panic attacks.  His anxiety is greatly improved.  Something is making him more sleepy he fails.  One of the medications perhaps.  Current Outpatient Medications on File Prior to Visit  Medication Sig Dispense Refill   cariprazine (VRAYLAR) 1.5 MG capsule Take 1 capsule (1.5 mg total) by mouth daily. 90 capsule 1   cetirizine (ZYRTEC) 10 MG tablet Take 10 mg by mouth daily.     docusate sodium (COLACE) 100 MG capsule Take 200 mg by mouth daily.     polyethylene glycol (MIRALAX / GLYCOLAX) 17 g packet Take 34 g by mouth daily.     sertraline (ZOLOFT) 50 MG tablet TAKE 1 TABLET(50 MG) BY MOUTH DAILY 90 tablet 1   No current facility-administered medications on file prior to visit.   Past Medical History:  Diagnosis Date   Anxiety    Essential hypertension 02/20/2020   GERD (gastroesophageal reflux disease)    Mixed hyperlipidemia 02/20/2020   Past Surgical History:  Procedure Laterality Date   KNEE ARTHROSCOPY WITH ANTERIOR CRUCIATE LIGAMENT (ACL) REPAIR Left 05/22/2020   Procedure: KNEE ARTHROSCOPY WITH ANTERIOR CRUCIATE LIGAMENT (ACL) REPAIR;  Surgeon: Hiram Gash, MD;  Location: [facility];  Service: Orthopedics;  Laterality: Left;   KNEE ARTHROSCOPY WITH MENISCAL REPAIR Left 05/22/2020   Procedure: LEFT KNEE ARTHROSCOPY WITH MEDIAL MENISCAL REPAIR;  Surgeon: Hiram Gash, MD;  Location: [facility];  Service: Orthopedics;  Laterality: Left;   wisdom teeth      Family History  Problem Relation Age of Onset   Heart disease Father     Emphysema Father    COPD Father    Heart attack Father    Diabetes Maternal Grandfather    Osteoporosis Mother    Colon cancer Neg Hx    Colon polyps Neg Hx    Esophageal cancer Neg Hx    Prostate cancer Neg Hx    Rectal cancer Neg Hx    Social History   Socioeconomic History   Marital status: Married    Spouse name: Not on file   Number of children: 1   Years of education: Not on file   Highest education level: Not on file  Occupational History   Occupation: General labor    Employer: [employer]  Tobacco Use   Smoking status: Former    Packs/day: 3.00    Years: 4.00    Pack years: 12.00    Types: Cigarettes    Quit date: 2003    Years since quitting: 20.4   Smokeless tobacco: Current    Types: Chew   Tobacco comments:    1 can a day  Vaping Use   Vaping Use: Never used  Substance and Sexual Activity   Alcohol use: Not Currently   Drug use: Never   Sexual activity: Yes    Partners: Female  Other Topics Concern   Not on file  Social History Narrative   Not on file   Social Determinants of Health  Financial Resource Strain: Not on file  Food Insecurity: Not on file  Transportation Needs: Not on file  Physical Activity: Not on file  Stress: Not on file  Social Connections: Not on file    Review of Systems  Constitutional:  Positive for fatigue. Negative for chills and fever.  HENT:  Positive for congestion.   Respiratory:  Negative for cough and shortness of breath.   Cardiovascular:  Negative for chest pain.  Gastrointestinal:  Positive for constipation (controlled with medications). Negative for abdominal pain.  Genitourinary:  Negative for dysuria.  Neurological:  Negative for dizziness and headaches.  Psychiatric/Behavioral:  Negative for dysphoric mood. The patient is not nervous/anxious.     Objective:  BP 138/86   Pulse 88   Temp 97.8 F (36.6 C)   Resp 18   Ht '5\' 11"'$  (1.803 m)   Wt (!) 337 lb (152.9 kg)   BMI 47.00 kg/m       04/16/2022    3:38 PM 02/03/2022    3:35 PM 01/08/2022    4:05 PM  BP/Weight  Systolic BP 834 196 222  Diastolic BP 86 72 72  Wt. (Lbs) 337 329 328  BMI 47 kg/m2 45.89 kg/m2 45.75 kg/m2    Physical Exam Vitals reviewed.  Constitutional:      Appearance: Normal appearance. He is obese.  Neck:     Vascular: No carotid bruit.  Cardiovascular:     Rate and Rhythm: Normal rate and regular rhythm.     Heart sounds: Normal heart sounds.  Pulmonary:     Effort: Pulmonary effort is normal.     Breath sounds: Normal breath sounds. No wheezing, rhonchi or rales.  Abdominal:     General: Bowel sounds are normal.     Palpations: Abdomen is soft.     Tenderness: There is no abdominal tenderness.  Neurological:     Mental Status: He is alert and oriented to person, place, and time.  Psychiatric:        Mood and Affect: Mood normal.        Behavior: Behavior normal.    Diabetic Foot Exam - Simple   No data filed      Lab Results  Component Value Date   WBC 9.4 01/08/2022   HGB 15.5 01/08/2022   HCT 46.5 01/08/2022   PLT 266 01/08/2022   GLUCOSE 81 04/16/2022   CHOL 163 01/08/2022   TRIG 142 01/08/2022   HDL 37 (L) 01/08/2022   LDLCALC 101 (H) 01/08/2022   ALT 42 04/16/2022   AST 31 04/16/2022   NA 141 04/16/2022   K 4.6 04/16/2022   CL 103 04/16/2022   CREATININE 1.32 (H) 04/16/2022   BUN 15 04/16/2022   CO2 24 04/16/2022   TSH 3.120 01/08/2022      Assessment & Plan:   Problem List Items Addressed This Visit       Other   Bipolar II disorder, mild, depressed, with anxious distress (HCC)    Zoloft 50 mg decrease to 50 mg 1/2 pill daily x 1 week, then discontinue.   Continue vraylar 1.5 mg daily.       Elevated serum creatinine    Check CMP       Relevant Orders   Comprehensive metabolic panel (Completed)   Decreased libido    Check Testosterone free and total.       Relevant Orders   Testosterone,Free and Total (Completed)   Apnea - Primary    Home  sleep test was ordered.       Relevant Orders   Home sleep test  .  No orders of the defined types were placed in this encounter.   Orders Placed This Encounter  Procedures   Testosterone,Free and Total   Comprehensive metabolic panel   Home sleep test    Follow-up: Return in about 2 months (around 06/16/2022) for chronic follow up.  An After Visit Summary was printed and given to the patient.  Rochel Brome, MD [facility] [phone]

## 2022-04-16 NOTE — Patient Instructions (Addendum)
Zoloft 50 mg decrease to 50 mg 1/2 pill daily x 1 week, then discontinue.   Continue vraylar 1.5 mg daily.  Check sleep study.

## 2022-04-17 LAB — COMPREHENSIVE METABOLIC PANEL
ALT: 42 IU/L (ref 0–44)
AST: 31 IU/L (ref 0–40)
Albumin/Globulin Ratio: 1.5 (ref 1.2–2.2)
Albumin: 4.4 g/dL (ref 4.0–5.0)
Alkaline Phosphatase: 96 IU/L (ref 44–121)
BUN/Creatinine Ratio: 11 (ref 9–20)
BUN: 15 mg/dL (ref 6–20)
Bilirubin Total: 0.4 mg/dL (ref 0.0–1.2)
CO2: 24 mmol/L (ref 20–29)
Calcium: 10 mg/dL (ref 8.7–10.2)
Chloride: 103 mmol/L (ref 96–106)
Creatinine, Ser: 1.32 mg/dL — ABNORMAL HIGH (ref 0.76–1.27)
Globulin, Total: 2.9 g/dL (ref 1.5–4.5)
Glucose: 81 mg/dL (ref 70–99)
Potassium: 4.6 mmol/L (ref 3.5–5.2)
Sodium: 141 mmol/L (ref 134–144)
Total Protein: 7.3 g/dL (ref 6.0–8.5)
eGFR: 71 mL/min/{1.73_m2} (ref >59)

## 2022-04-17 NOTE — Assessment & Plan Note (Signed)
Zoloft 50 mg decrease to 50 mg 1/2 pill daily x 1 week, then discontinue.   Continue vraylar 1.5 mg daily.

## 2022-04-17 NOTE — Assessment & Plan Note (Signed)
Home sleep test was ordered.

## 2022-04-17 NOTE — Assessment & Plan Note (Addendum)
Check Testosterone free and total.

## 2022-04-17 NOTE — Assessment & Plan Note (Signed)
Check CMP.  ?

## 2022-04-18 LAB — TESTOSTERONE,FREE AND TOTAL
Testosterone, Free: 4.4 pg/mL — ABNORMAL LOW (ref 8.7–25.1)
Testosterone: 361 ng/dL (ref 264–916)

## 2022-04-20 ENCOUNTER — Other Ambulatory Visit: Payer: Self-pay

## 2022-04-20 MED ORDER — TESTOSTERONE CYPIONATE 200 MG/ML IM SOLN: 200.0000 mg | INTRAMUSCULAR | 0 refills | Status: DC

## 2022-04-27 ENCOUNTER — Encounter: Payer: Self-pay | Admitting: Family Medicine

## 2022-05-18 ENCOUNTER — Other Ambulatory Visit: Payer: Self-pay

## 2022-05-18 DIAGNOSIS — R0681 Apnea, not elsewhere classified: Secondary | ICD-10-CM

## 2022-05-26 ENCOUNTER — Ambulatory Visit: Payer: Managed Care, Other (non HMO) | Admitting: Family Medicine

## 2022-06-03 ENCOUNTER — Encounter: Payer: Self-pay | Admitting: Family Medicine

## 2022-06-08 ENCOUNTER — Telehealth: Payer: Self-pay

## 2022-06-08 NOTE — Telephone Encounter (Signed)
Per Dr Tobie Poet gave him samples of Vraylar for one month because he is switching insurance.

## 2022-06-18 ENCOUNTER — Ambulatory Visit: Payer: Managed Care, Other (non HMO) | Admitting: Family Medicine

## 2022-07-13 ENCOUNTER — Encounter: Payer: Self-pay | Admitting: Family Medicine

## 2022-07-20 ENCOUNTER — Ambulatory Visit: Payer: No Typology Code available for payment source | Admitting: Family Medicine

## 2022-07-20 ENCOUNTER — Encounter: Payer: Self-pay | Admitting: Family Medicine

## 2022-07-20 VITALS — BP 128/68 | HR 82 | Temp 97.9°F | Ht 71.0 in | Wt 340.0 lb

## 2022-07-20 DIAGNOSIS — F3181 Bipolar II disorder: Secondary | ICD-10-CM

## 2022-07-20 DIAGNOSIS — R6882 Decreased libido: Secondary | ICD-10-CM

## 2022-07-20 DIAGNOSIS — R0681 Apnea, not elsewhere classified: Secondary | ICD-10-CM

## 2022-07-20 DIAGNOSIS — G4733 Obstructive sleep apnea (adult) (pediatric): Secondary | ICD-10-CM

## 2022-07-20 DIAGNOSIS — R5383 Other fatigue: Secondary | ICD-10-CM | POA: Diagnosis not present

## 2022-07-20 DIAGNOSIS — E291 Testicular hypofunction: Secondary | ICD-10-CM | POA: Diagnosis not present

## 2022-07-20 NOTE — Progress Notes (Signed)
Subjective:  Patient ID: Mitchell Booker, male    DOB: June 01, 1981  Age: 41 y.o. MRN: [id number]  Chief Complaint  Patient presents with   Bipolar disorder    2 month follow up    HPI Bipolar Disorder: 2 month follow up. On zoloft 50 mg daily and vraylar 1.5 mg once daily has helped with his adhd and depression. His wife says it cause him to sleep a lot.    Severe CPAP: Wearing cpap. This has helped.   Low T: on testosterone injections.  Helping with erectile dysfunction and energy level.      07/20/2022    3:30 PM 04/16/2022    3:42 PM 02/03/2022    3:38 PM  PHQ9 SCORE ONLY  PHQ-9 Total Score '2 9 5    '$ Current Outpatient Medications on File Prior to Visit  Medication Sig Dispense Refill   cariprazine (VRAYLAR) 1.5 MG capsule Take 1 capsule (1.5 mg total) by mouth daily. 90 capsule 1   cetirizine (ZYRTEC) 10 MG tablet Take 10 mg by mouth daily.     docusate sodium (COLACE) 100 MG capsule Take 200 mg by mouth daily.     polyethylene glycol (MIRALAX / GLYCOLAX) 17 g packet Take 34 g by mouth daily.     sertraline (ZOLOFT) 50 MG tablet Take 50 mg by mouth daily.     testosterone cypionate (DEPOTESTOSTERONE CYPIONATE) 200 MG/ML injection Inject 1 mL (200 mg total) into the muscle every 14 (fourteen) days. 10 mL 0   No current facility-administered medications on file prior to visit.   Past Medical History:  Diagnosis Date   Anxiety    Essential hypertension 02/20/2020   GERD (gastroesophageal reflux disease)    Mixed hyperlipidemia 02/20/2020   Past Surgical History:  Procedure Laterality Date   KNEE ARTHROSCOPY WITH ANTERIOR CRUCIATE LIGAMENT (ACL) REPAIR Left 05/22/2020   Procedure: KNEE ARTHROSCOPY WITH ANTERIOR CRUCIATE LIGAMENT (ACL) REPAIR;  Surgeon: Hiram Gash, MD;  Location: [facility];  Service: Orthopedics;  Laterality: Left;   KNEE ARTHROSCOPY WITH MENISCAL REPAIR Left 05/22/2020   Procedure: LEFT KNEE ARTHROSCOPY WITH MEDIAL MENISCAL REPAIR;  Surgeon:  Hiram Gash, MD;  Location: [facility];  Service: Orthopedics;  Laterality: Left;   wisdom teeth      Family History  Problem Relation Age of Onset   Heart disease Father    Emphysema Father    COPD Father    Heart attack Father    Diabetes Maternal Grandfather    Osteoporosis Mother    Colon cancer Neg Hx    Colon polyps Neg Hx    Esophageal cancer Neg Hx    Prostate cancer Neg Hx    Rectal cancer Neg Hx    Social History   Socioeconomic History   Marital status: Married    Spouse name: Not on file   Number of children: 1   Years of education: Not on file   Highest education level: Not on file  Occupational History   Occupation: General labor    Employer: [employer]  Tobacco Use   Smoking status: Former    Packs/day: 3.00    Years: 4.00    Total pack years: 12.00    Types: Cigarettes    Quit date: 2003    Years since quitting: 20.6   Smokeless tobacco: Current    Types: Chew   Tobacco comments:    1 can a day  Vaping Use   Vaping Use:  Never used  Substance and Sexual Activity   Alcohol use: Not Currently   Drug use: Never   Sexual activity: Yes    Partners: Female  Other Topics Concern   Not on file  Social History Narrative   Not on file   Social Determinants of Health   Financial Resource Strain: Not on file  Food Insecurity: Not on file  Transportation Needs: Not on file  Physical Activity: Not on file  Stress: Not on file  Social Connections: Not on file    Review of Systems  Constitutional:  Negative for appetite change, fatigue and fever.  HENT:  Negative for congestion, ear pain, sinus pressure and sore throat.   Respiratory:  Negative for cough, chest tightness, shortness of breath and wheezing.   Cardiovascular:  Negative for chest pain and palpitations.  Gastrointestinal:  Negative for abdominal pain, constipation, diarrhea, nausea and vomiting.  Genitourinary:  Negative for dysuria and hematuria.   Musculoskeletal:  Negative for arthralgias, back pain, joint swelling and myalgias.  Skin:  Negative for rash.  Neurological:  Negative for dizziness, weakness and headaches.  Psychiatric/Behavioral:  Negative for dysphoric mood. The patient is not nervous/anxious.      Objective:  BP 128/68 (BP Location: Left Arm, Patient Position: Sitting)   Pulse 82   Temp 97.9 F (36.6 C) (Temporal)   Ht '5\' 11"'$  (1.803 m)   Wt (!) 340 lb (154.2 kg)   SpO2 96%   BMI 47.42 kg/m      07/20/2022    3:28 PM 04/16/2022    3:38 PM 02/03/2022    3:35 PM  BP/Weight  Systolic BP 992 426 834  Diastolic BP 68 86 72  Wt. (Lbs) 340 337 329  BMI 47.42 kg/m2 47 kg/m2 45.89 kg/m2    Physical Exam Vitals reviewed.  Constitutional:      Appearance: Normal appearance. He is normal weight.  Cardiovascular:     Rate and Rhythm: Normal rate and regular rhythm.     Heart sounds: Normal heart sounds.  Pulmonary:     Effort: Pulmonary effort is normal.     Breath sounds: Normal breath sounds.  Abdominal:     General: Abdomen is flat. Bowel sounds are normal.     Palpations: Abdomen is soft.     Tenderness: There is no abdominal tenderness.  Neurological:     Mental Status: He is alert and oriented to person, place, and time.  Psychiatric:        Mood and Affect: Mood normal.        Behavior: Behavior normal.     Diabetic Foot Exam - Simple   No data filed      Lab Results  Component Value Date   WBC 9.4 01/08/2022   HGB 15.5 01/08/2022   HCT 46.5 01/08/2022   PLT 266 01/08/2022   GLUCOSE 81 04/16/2022   CHOL 163 01/08/2022   TRIG 142 01/08/2022   HDL 37 (L) 01/08/2022   LDLCALC 101 (H) 01/08/2022   ALT 42 04/16/2022   AST 31 04/16/2022   NA 141 04/16/2022   K 4.6 04/16/2022   CL 103 04/16/2022   CREATININE 1.32 (H) 04/16/2022   BUN 15 04/16/2022   CO2 24 04/16/2022   TSH 3.120 01/08/2022      Assessment & Plan:   Problem List Items Addressed This Visit       Respiratory    OSA treated with BiPAP    Continue use of bipap.  Endocrine   Hypogonadism in male    Continue testosterone shots.       Relevant Orders   Testosterone,Free and Total     Other   Bipolar II disorder, mild, depressed, with anxious distress ([place]) - Primary    The current medical regimen is effective;  continue present plan and medications. Continue zoloft 50 mg once daily and vraylar 1.5 mg once daily.       Relevant Medications   sertraline (ZOLOFT) 50 MG tablet   Decreased libido    Continue testosterone injections.       Other fatigue    Check labs.       Relevant Orders   CBC with Differential/Platelet   Comprehensive metabolic panel  .  No orders of the defined types were placed in this encounter.   Orders Placed This Encounter  Procedures   CBC with Differential/Platelet   Comprehensive metabolic panel   Testosterone,Free and Total     Follow-up: Return in about 3 weeks (around 08/10/2022).  An After Visit Summary was printed and given to the patient.   Rochel Brome, MD [facility] [phone]

## 2022-07-21 ENCOUNTER — Other Ambulatory Visit: Payer: No Typology Code available for payment source

## 2022-07-21 DIAGNOSIS — E291 Testicular hypofunction: Secondary | ICD-10-CM

## 2022-07-21 DIAGNOSIS — R5383 Other fatigue: Secondary | ICD-10-CM

## 2022-07-25 DIAGNOSIS — E291 Testicular hypofunction: Secondary | ICD-10-CM | POA: Insufficient documentation

## 2022-07-25 DIAGNOSIS — R5383 Other fatigue: Secondary | ICD-10-CM | POA: Insufficient documentation

## 2022-07-25 NOTE — Assessment & Plan Note (Signed)
Continue testosterone injections.

## 2022-07-25 NOTE — Assessment & Plan Note (Signed)
Continue use of bipap.

## 2022-07-25 NOTE — Assessment & Plan Note (Signed)
Continue testosterone shots.  

## 2022-07-25 NOTE — Assessment & Plan Note (Signed)
Check labs 

## 2022-07-25 NOTE — Assessment & Plan Note (Signed)
The current medical regimen is effective;  continue present plan and medications. Continue zoloft 50 mg once daily and vraylar 1.5 mg once daily.

## 2022-07-27 ENCOUNTER — Encounter: Payer: Self-pay | Admitting: Family Medicine

## 2022-07-28 LAB — COMPREHENSIVE METABOLIC PANEL
ALT: 39 IU/L (ref 0–44)
AST: 27 IU/L (ref 0–40)
Albumin/Globulin Ratio: 1.6 (ref 1.2–2.2)
Albumin: 4.2 g/dL (ref 4.1–5.1)
Alkaline Phosphatase: 85 IU/L (ref 44–121)
BUN/Creatinine Ratio: 8 — ABNORMAL LOW (ref 9–20)
BUN: 11 mg/dL (ref 6–20)
Bilirubin Total: 0.6 mg/dL (ref 0.0–1.2)
CO2: 21 mmol/L (ref 20–29)
Calcium: 9.1 mg/dL (ref 8.7–10.2)
Chloride: 105 mmol/L (ref 96–106)
Creatinine, Ser: 1.3 mg/dL — ABNORMAL HIGH (ref 0.76–1.27)
Globulin, Total: 2.7 g/dL (ref 1.5–4.5)
Glucose: 95 mg/dL (ref 70–99)
Potassium: 4.7 mmol/L (ref 3.5–5.2)
Sodium: 140 mmol/L (ref 134–144)
Total Protein: 6.9 g/dL (ref 6.0–8.5)
eGFR: 72 mL/min/{1.73_m2} (ref >59)

## 2022-07-28 LAB — CBC WITH DIFFERENTIAL/PLATELET
Basophils Absolute: 0.1 10*3/uL (ref 0.0–0.2)
Basos: 1 %
EOS (ABSOLUTE): 0.1 10*3/uL (ref 0.0–0.4)
Eos: 1 %
Hematocrit: 47.8 % (ref 37.5–51.0)
Hemoglobin: 16.3 g/dL (ref 13.0–17.7)
Immature Grans (Abs): 0 10*3/uL (ref 0.0–0.1)
Immature Granulocytes: 0 %
Lymphocytes Absolute: 2.6 10*3/uL (ref 0.7–3.1)
Lymphs: 34 %
MCH: 29.4 pg (ref 26.6–33.0)
MCHC: 34.1 g/dL (ref 31.5–35.7)
MCV: 86 fL (ref 79–97)
Monocytes Absolute: 0.5 10*3/uL (ref 0.1–0.9)
Monocytes: 6 %
Neutrophils Absolute: 4.5 10*3/uL (ref 1.4–7.0)
Neutrophils: 58 %
Platelets: 246 10*3/uL (ref 150–450)
RBC: 5.54 x10E6/uL (ref 4.14–5.80)
RDW: 13.1 % (ref 11.6–15.4)
WBC: 7.8 10*3/uL (ref 3.4–10.8)

## 2022-07-28 LAB — TESTOSTERONE,FREE AND TOTAL
Testosterone, Free: 16.3 pg/mL (ref 8.7–25.1)
Testosterone: 1108 ng/dL — ABNORMAL HIGH (ref 264–916)

## 2022-08-03 ENCOUNTER — Encounter: Payer: Self-pay | Admitting: Family Medicine

## 2022-08-05 ENCOUNTER — Other Ambulatory Visit: Payer: Self-pay

## 2022-08-05 ENCOUNTER — Telehealth: Payer: Self-pay

## 2022-08-05 MED ORDER — ARIPIPRAZOLE 5 MG PO TABS: 5.0000 mg | ORAL_TABLET | Freq: Every day | ORAL | 1 refills | Status: DC

## 2022-08-05 NOTE — Telephone Encounter (Signed)
Left message informing patient that samples are available for pick up.

## 2022-08-06 ENCOUNTER — Ambulatory Visit: Payer: Managed Care, Other (non HMO) | Admitting: Family Medicine

## 2022-09-20 ENCOUNTER — Other Ambulatory Visit: Payer: Self-pay | Admitting: Family Medicine

## 2022-10-13 ENCOUNTER — Ambulatory Visit: Payer: Managed Care, Other (non HMO) | Admitting: Family Medicine

## 2022-10-14 ENCOUNTER — Ambulatory Visit (INDEPENDENT_AMBULATORY_CARE_PROVIDER_SITE_OTHER): Payer: No Typology Code available for payment source | Admitting: Family Medicine

## 2022-10-14 VITALS — BP 128/72 | HR 75 | Temp 97.9°F | Resp 18 | Ht 71.0 in | Wt 335.8 lb

## 2022-10-14 DIAGNOSIS — Z23 Encounter for immunization: Secondary | ICD-10-CM

## 2022-10-14 DIAGNOSIS — E782 Mixed hyperlipidemia: Secondary | ICD-10-CM | POA: Diagnosis not present

## 2022-10-14 DIAGNOSIS — Z6841 Body Mass Index (BMI) 40.0 and over, adult: Secondary | ICD-10-CM

## 2022-10-14 DIAGNOSIS — E291 Testicular hypofunction: Secondary | ICD-10-CM

## 2022-10-14 DIAGNOSIS — F3181 Bipolar II disorder: Secondary | ICD-10-CM | POA: Diagnosis not present

## 2022-10-14 MED ORDER — TESTOSTERONE CYPIONATE 200 MG/ML IM SOLN: INTRAMUSCULAR | 0 refills | Status: DC

## 2022-10-14 MED ORDER — SERTRALINE HCL 50 MG PO TABS: 50.0000 mg | ORAL_TABLET | Freq: Every day | ORAL | 1 refills | Status: DC

## 2022-10-14 MED ORDER — ARIPIPRAZOLE 5 MG PO TABS: 5.0000 mg | ORAL_TABLET | Freq: Every day | ORAL | 1 refills | Status: DC

## 2022-10-14 NOTE — Progress Notes (Signed)
Subjective:  Patient ID: Mitchell Booker, male    DOB: Feb 07, 1981  Age: 41 y.o. MRN: [id number]  Chief Complaint  Patient presents with   Hyperlipidemia    Hyperlipidemia Pertinent negatives include no chest pain or shortness of breath.  HPI Wife is concerned he has sleep apnea. He snores and stops breathing in the night. Sleepy all day long if not working.  Mild depressed bipolar 2 disorder (HCC) - on abilify 5 mg daily and zoloft 50 mg daily.  Patient is not having panic attacks.  His anxiety is greatly improved.  Something is making him more sleepy he feels.  One of the medications perhaps.  Hypogonadism: on testosterone shots 200 mg one every 2 weeks.   Not eating healthy or exercising.   Current Outpatient Medications on File Prior to Visit  Medication Sig Dispense Refill   cetirizine (ZYRTEC) 10 MG tablet Take 10 mg by mouth daily.     docusate sodium (COLACE) 100 MG capsule Take 200 mg by mouth daily.     polyethylene glycol (MIRALAX / GLYCOLAX) 17 g packet Take 34 g by mouth daily.     No current facility-administered medications on file prior to visit.   Past Medical History:  Diagnosis Date   Anxiety    Essential hypertension 02/20/2020   GERD (gastroesophageal reflux disease)    Mixed hyperlipidemia 02/20/2020   Past Surgical History:  Procedure Laterality Date   KNEE ARTHROSCOPY WITH ANTERIOR CRUCIATE LIGAMENT (ACL) REPAIR Left 05/22/2020   Procedure: KNEE ARTHROSCOPY WITH ANTERIOR CRUCIATE LIGAMENT (ACL) REPAIR;  Surgeon: Hiram Gash, MD;  Location: [facility];  Service: Orthopedics;  Laterality: Left;   KNEE ARTHROSCOPY WITH MENISCAL REPAIR Left 05/22/2020   Procedure: LEFT KNEE ARTHROSCOPY WITH MEDIAL MENISCAL REPAIR;  Surgeon: Hiram Gash, MD;  Location: [place];  Service: Orthopedics;  Laterality: Left;   wisdom teeth      Family History  Problem Relation Age of Onset   Heart disease Father    Emphysema Father    COPD  Father    Heart attack Father    Diabetes Maternal Grandfather    Osteoporosis Mother    Colon cancer Neg Hx    Colon polyps Neg Hx    Esophageal cancer Neg Hx    Prostate cancer Neg Hx    Rectal cancer Neg Hx    Social History   Socioeconomic History   Marital status: Married    Spouse name: Not on file   Number of children: 1   Years of education: Not on file   Highest education level: Not on file  Occupational History   Occupation: General labor    Employer: [employer]  Tobacco Use   Smoking status: Former    Packs/day: 3.00    Years: 4.00    Total pack years: 12.00    Types: Cigarettes    Quit date: 2003    Years since quitting: 20.8   Smokeless tobacco: Current    Types: Chew   Tobacco comments:    1 can a day  Vaping Use   Vaping Use: Never used  Substance and Sexual Activity   Alcohol use: Not Currently   Drug use: Never   Sexual activity: Yes    Partners: Female  Other Topics Concern   Not on file  Social History Narrative   Not on file   Social Determinants of Health   Financial Resource Strain: Not on file  Food Insecurity:  Not on file  Transportation Needs: Not on file  Physical Activity: Not on file  Stress: Not on file  Social Connections: Not on file    Review of Systems  Constitutional:  Negative for chills, fatigue and fever.  HENT:  Negative for congestion, ear pain and sore throat.   Respiratory:  Negative for cough and shortness of breath.   Cardiovascular:  Negative for chest pain.  Gastrointestinal:  Negative for abdominal pain, constipation, diarrhea, nausea and vomiting.  Endocrine: Negative for polydipsia, polyphagia and polyuria.  Genitourinary:  Negative for dysuria, frequency and urgency.  Musculoskeletal:  Negative for arthralgias and back pain.  Neurological:  Negative for dizziness and headaches.  Psychiatric/Behavioral:  Negative for dysphoric mood. The patient is not nervous/anxious.      Objective:  BP  128/72   Pulse 75   Temp 97.9 F (36.6 C)   Resp 18   Ht '5\' 11"'$  (1.803 m)   Wt (!) 335 lb 12.8 oz (152.3 kg)   SpO2 95%   BMI 46.83 kg/m      10/14/2022    3:36 PM 07/20/2022    3:28 PM 04/16/2022    3:38 PM  BP/Weight  Systolic BP 151 761 607  Diastolic BP 72 68 86  Wt. (Lbs) 335.8 340 337  BMI 46.83 kg/m2 47.42 kg/m2 47 kg/m2    Physical Exam Vitals reviewed.  Constitutional:      Appearance: Normal appearance. He is obese.  Cardiovascular:     Rate and Rhythm: Normal rate and regular rhythm.     Heart sounds: Normal heart sounds.  Pulmonary:     Effort: Pulmonary effort is normal.     Breath sounds: Normal breath sounds.  Abdominal:     General: Bowel sounds are normal.     Palpations: Abdomen is soft.     Tenderness: There is no abdominal tenderness.  Neurological:     Mental Status: He is alert.     Diabetic Foot Exam - Simple   No data filed      Lab Results  Component Value Date   WBC 7.8 07/21/2022   HGB 16.3 07/21/2022   HCT 47.8 07/21/2022   PLT 246 07/21/2022   GLUCOSE 95 07/21/2022   CHOL 163 01/08/2022   TRIG 142 01/08/2022   HDL 37 (L) 01/08/2022   LDLCALC 101 (H) 01/08/2022   ALT 39 07/21/2022   AST 27 07/21/2022   NA 140 07/21/2022   K 4.7 07/21/2022   CL 105 07/21/2022   CREATININE 1.30 (H) 07/21/2022   BUN 11 07/21/2022   CO2 21 07/21/2022   TSH 3.120 01/08/2022      Assessment & Plan:   Problem List Items Addressed This Visit       Endocrine   Hypogonadism in male    Continue testosterone shots every 2 weeks.       Relevant Medications   testosterone cypionate (DEPOTESTOSTERONE CYPIONATE) 200 MG/ML injection     Other   Mixed hyperlipidemia - Primary    Recommend continue to work on eating healthy diet and exercise.       Class 3 severe obesity due to excess calories without serious comorbidity with body mass index (BMI) of 45.0 to 49.9 in adult [facility])    Recommend work on eating healthy diet and exercise.        Bipolar II disorder, mild, depressed, with anxious distress ([place])    The current medical regimen is effective;  continue present plan and  medications. Continue abilify 5 mg daily and zoloft 50 mg daily.        Relevant Medications   ARIPiprazole (ABILIFY) 5 MG tablet   sertraline (ZOLOFT) 50 MG tablet   Encounter for immunization   Relevant Orders   Flu Vaccine MDCK QUAD PF (Completed)  .  Meds ordered this encounter  Medications   ARIPiprazole (ABILIFY) 5 MG tablet    Sig: Take 1 tablet (5 mg total) by mouth daily.    Dispense:  90 tablet    Refill:  1   testosterone cypionate (DEPOTESTOSTERONE CYPIONATE) 200 MG/ML injection    Sig: INJECT 1 ML INTO THE MUSCLE EVERY 14 DAYS    Dispense:  10 mL    Refill:  0   sertraline (ZOLOFT) 50 MG tablet    Sig: Take 1 tablet (50 mg total) by mouth daily.    Dispense:  90 tablet    Refill:  1    Orders Placed This Encounter  Procedures   Flu Vaccine MDCK QUAD PF     Follow-up: Return in about 6 months (around 04/14/2023) for chronic follow up.  An After Visit Summary was printed and given to the patient.  Rochel Brome, MD [facility] [phone]

## 2022-10-19 ENCOUNTER — Encounter: Payer: Self-pay | Admitting: Family Medicine

## 2022-10-19 ENCOUNTER — Other Ambulatory Visit: Payer: Self-pay

## 2022-10-19 DIAGNOSIS — Z23 Encounter for immunization: Secondary | ICD-10-CM | POA: Insufficient documentation

## 2022-10-19 NOTE — Assessment & Plan Note (Addendum)
The current medical regimen is effective;  continue present plan and medications. Continue abilify 5 mg daily and zoloft 50 mg daily.

## 2022-10-19 NOTE — Assessment & Plan Note (Signed)
Recommend continue to work on eating healthy diet and exercise.  

## 2022-10-19 NOTE — Assessment & Plan Note (Signed)
Continue testosterone shots every 2 weeks.

## 2022-10-19 NOTE — Assessment & Plan Note (Signed)
Recommend work on eating healthy diet and exercise.  

## 2023-01-07 ENCOUNTER — Telehealth: Payer: Self-pay | Admitting: Gastroenterology

## 2023-01-07 ENCOUNTER — Encounter: Payer: Self-pay | Admitting: Gastroenterology

## 2023-02-03 ENCOUNTER — Telehealth: Payer: Self-pay

## 2023-02-03 NOTE — Telephone Encounter (Signed)
PA submitted and approved via promptpa for testosterone. From 02/03/2023 through 05/05/2023.

## 2023-02-10 ENCOUNTER — Encounter: Payer: Self-pay | Admitting: Gastroenterology

## 2023-02-10 ENCOUNTER — Ambulatory Visit (INDEPENDENT_AMBULATORY_CARE_PROVIDER_SITE_OTHER): Payer: No Typology Code available for payment source | Admitting: Gastroenterology

## 2023-02-10 VITALS — BP 102/66 | Ht 71.0 in | Wt 335.2 lb

## 2023-02-10 DIAGNOSIS — K6289 Other specified diseases of anus and rectum: Secondary | ICD-10-CM

## 2023-02-10 DIAGNOSIS — K602 Anal fissure, unspecified: Secondary | ICD-10-CM

## 2023-02-10 MED ORDER — HYDROCORTISONE ACETATE 25 MG RE SUPP: 25.0000 mg | Freq: Two times a day (BID) | RECTAL | 6 refills | Status: DC | PRN

## 2023-02-10 NOTE — Progress Notes (Signed)
Chief Complaint: FU Rectal pain  Referring Provider: Dr. Reinaldo Meeker   ASSESSMENT AND PLAN;   #1. Rectal pain (due to anal fissure). Neg colon 04/03/2019 except for small hyperplastic rectal polyps, small internal hemorrhoids.  Otherwise normal to TI.  No Crohn's. Neg CT pelvis with contrast 04/30/2019. Didn't fill topical Cardizem prescription.  Plan: -Colace 100 BID -MiraLAX 17 g p.o. BID with 8oz water.. -Anusol HC supp 1 BID PRN x 10 days, 6RF -Carry on sitz baths PRN for now. -Good perirectal hygiene, can use unscented baby wipes, keep the perirectal area clean and dry. -Minimize nonsteroidals. -Call if any problems.  If still with problems, proceed with colon followed by repeat Sx eval, only if needed. -Otherwise routine colonoscopy at age 78 for CRC screening. -D/W pt and wife in detail.  All contact numbers were given.  HPI:   42 year old very pleasant male   For follow-up Accompanied by his wife.  And intermittent rectal pain for several years.  Occasional rectal bleeding but not much. Associated with constipation-previously better on stool softeners and MiraLAX. He does lift heavy weights Had a negative colonoscopy 03/2019 Diagnosed with rectal fissure Has been on multiple OTC creams with some benefit while he he was on it.  Has also seen surgery-Dr. Foye Spurling for lateral sphincterotomy/fissure repair.  He was scheduled but he backed off at the last minute.  No fever chills night sweats, weight loss, family history of colon cancer or significant rectal drainage.  There is no family history of Crohn's disease.   He does have history of lifting weights and has been lifting heavy furniture at work.  Denies having any upper GI symptoms.  Past Medical History:  Diagnosis Date   Anxiety    Essential hypertension 02/20/2020   GERD (gastroesophageal reflux disease)    Mixed hyperlipidemia 02/20/2020    Past Surgical History:  Procedure Laterality Date    KNEE ARTHROSCOPY WITH ANTERIOR CRUCIATE LIGAMENT (ACL) REPAIR Left 05/22/2020   Procedure: KNEE ARTHROSCOPY WITH ANTERIOR CRUCIATE LIGAMENT (ACL) REPAIR;  Surgeon: Hiram Gash, MD;  Location: [place];  Service: Orthopedics;  Laterality: Left;   KNEE ARTHROSCOPY WITH MENISCAL REPAIR Left 05/22/2020   Procedure: LEFT KNEE ARTHROSCOPY WITH MEDIAL MENISCAL REPAIR;  Surgeon: Hiram Gash, MD;  Location: [place];  Service: Orthopedics;  Laterality: Left;   wisdom teeth      Family History  Problem Relation Age of Onset   Osteoporosis Mother    Colon polyps Mother        benign   Heart disease Father    Emphysema Father    COPD Father    Heart attack Father    Lymphoma Father    Diabetes Maternal Grandfather    Colon cancer Neg Hx    Esophageal cancer Neg Hx    Prostate cancer Neg Hx    Rectal cancer Neg Hx     Social History   Tobacco Use   Smoking status: Former    Packs/day: 3.00    Years: 4.00    Total pack years: 12.00    Types: Cigarettes    Quit date: 2003    Years since quitting: 21.2   Smokeless tobacco: Current    Types: Chew   Tobacco comments:    1 can a day  Vaping Use   Vaping Use: Never used  Substance Use Topics   Alcohol use: Not Currently   Drug use: Never    Current Outpatient  Medications  Medication Sig Dispense Refill   ARIPiprazole (ABILIFY) 5 MG tablet Take 1 tablet (5 mg total) by mouth daily. 90 tablet 1   cetirizine (ZYRTEC) 10 MG tablet Take 10 mg by mouth daily.     docusate sodium (COLACE) 100 MG capsule Take 200 mg by mouth daily.     polyethylene glycol (MIRALAX / GLYCOLAX) 17 g packet Take 34 g by mouth daily.     sertraline (ZOLOFT) 50 MG tablet Take 1 tablet (50 mg total) by mouth daily. 90 tablet 1   testosterone cypionate (DEPOTESTOSTERONE CYPIONATE) 200 MG/ML injection INJECT 1 ML INTO THE MUSCLE EVERY 14 DAYS 10 mL 0   No current facility-administered medications for this visit.    Allergies   Allergen Reactions   Adderall [Amphetamine-Dextroamphetamine]     Severe depression, suicidality.   Codeine Rash   Penicillins Rash    Review of Systems:  neg     Physical Exam:    BP 102/66   Ht 5\' 11"  (1.803 m)   Wt (!) 335 lb 3.2 oz (152 kg)   SpO2 96%   BMI 46.75 kg/m  Filed Weights   02/10/23 1027  Weight: (!) 335 lb 3.2 oz (152 kg)  Gen: awake, alert, NAD HEENT: anicteric, no pallor CV: RRR, no mrg Pulm: CTA b/l Abd: soft, NT/ND, +BS throughout Rectal exam-in presence of Brooke and patient's wife.  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  He did have voluntary muscle spasms, brown heme-negative stools.  No definite masses. Ext: no c/c/e Neuro: nonfocal        Carmell Austria, MD   Cc: Dr. Reinaldo Meeker

## 2023-02-10 NOTE — Patient Instructions (Signed)
_______________________________________________________  If your blood pressure at your visit was 140/90 or greater, please contact your primary care physician to follow up on this.  _______________________________________________________  If you are age 40 or older, your body mass index should be between 23-30. Your Body mass index is 46.75 kg/m. If this is out of the aforementioned range listed, please consider follow up with your Primary Care Provider.  If you are age 69 or younger, your body mass index should be between 19-25. Your Body mass index is 46.75 kg/m. If this is out of the aformentioned range listed, please consider follow up with your Primary Care Provider.   ________________________________________________________  The [facility] providers would like to encourage you to use [facility] to communicate with providers for non-urgent requests or questions.  Due to long hold times on the telephone, sending your provider a message by [facility] may be a faster and more efficient way to get a response.  Please allow 48 business hours for a response.  Please remember that this is for non-urgent requests.  _______________________________________________________  We have sent the following medications to your pharmacy for you to pick up at your convenience: Anusol supp 1 2 times a day for 10 days  Continue colace 1 tablet 2 times a day Miralax 17g 2 times a day  How to Take a [employer] A sitz bath is a warm water bath that may be used to care for your rectum, genital area, or the area between your rectum and genitals (perineum). In a sitz bath, the water only comes up to your hips and covers your buttocks. A sitz bath may be done in a bathtub or with a portable sitz bath that fits over the toilet. Your health care provider may recommend a sitz bath to help: Relieve pain and discomfort after delivering a baby. Relieve pain and itching from hemorrhoids or anal fissures. Relieve pain after  certain surgeries. Relax muscles that are sore or tight. How to take a sitz bath Take 2-4 sitz baths a day, or as many as told by your health care provider. Bathtub sitz bath To take a sitz bath in a bathtub: Partially fill a bathtub with warm water. The water should be deep enough to cover your hips and buttocks when you are sitting in the bathtub. Follow your health care provider's instructions if you are told to put medicine in the water. Sit in the water. Open the bathtub drain a little, and leave it open during your bath. Turn on the warm water again, enough to replace the water that is draining out. Keep the water running throughout your bath. This helps keep the water at the right level and temperature. Soak in the water for 15-20 minutes, or as long as told by your health care provider. When you are done, be careful when you stand up. You may feel dizzy. After the sitz bath, pat yourself dry. Do not rub your skin to dry it.  Over-the-toilet sitz bath To take a sitz bath with an over-the-toilet basin: Follow the manufacturer's instructions. Fill the basin with warm water. Follow your health care provider's instructions if you were told to put medicine in the water. Sit on the seat. Make sure the water covers your buttocks and perineum. Soak in the water for 15-20 minutes, or as long as told by your health care provider. After the sitz bath, pat yourself dry. Do not rub your skin to dry it. Clean and dry the basin between uses. Discard the  basin if it cracks, or according to the manufacturer's instructions.  Contact a health care provider if: Your pain or itching gets worse. Stop doing sitz baths if your symptoms get worse. You have new symptoms. Stop doing sitz baths until you talk with your health care provider. Summary A sitz bath is a warm water bath in which the water only comes up to your hips and covers your buttocks. Your health care provider may recommend a sitz bath to  help relieve pain and discomfort after delivering a baby, relieve pain and itching from hemorrhoids or anal fissures, relieve pain after certain surgeries, or help to relax muscles that are sore or tight. Take 2-4 sitz baths a day, or as many as told by your health care provider. Soak in the water for 15-20 minutes. Stop doing sitz baths if your symptoms get worse. This information is not intended to replace advice given to you by your health care provider. Make sure you discuss any questions you have with your health care provider. Document Revised: 02/17/2022 Document Reviewed: 02/17/2022 Elsevier Patient Education  [place].

## 2023-02-11 ENCOUNTER — Encounter: Payer: Self-pay | Admitting: Gastroenterology

## 2023-02-18 NOTE — Telephone Encounter (Signed)
They look like congenital nevi. Normally they are not of much concern  Still to be on the safer side-lets get dermatology appointment RG

## 2023-03-12 ENCOUNTER — Other Ambulatory Visit: Payer: Self-pay

## 2023-03-12 ENCOUNTER — Encounter: Payer: Self-pay | Admitting: Gastroenterology

## 2023-03-12 MED ORDER — HYDROCORTISONE ACETATE 25 MG RE SUPP: 25.0000 mg | Freq: Two times a day (BID) | RECTAL | 6 refills | Status: AC | PRN

## 2023-03-15 ENCOUNTER — Encounter: Payer: Self-pay | Admitting: Gastroenterology

## 2023-03-15 ENCOUNTER — Telehealth: Payer: Self-pay | Admitting: Gastroenterology

## 2023-03-15 NOTE — Telephone Encounter (Signed)
Inbound call from patient spouse stating patient is having complications with hemmroids and he is in a lot of pain. Currently taking over the counter cream. Please advise   Thank you

## 2023-03-15 NOTE — Telephone Encounter (Signed)
The pt has what he believes is internal hemorrhoids that are causing a lot of pain. He does not think it is the fissure causing the pain he says it feels different.  He does state that he tried to use the anusol supp but he does not like to use supp and it was too painful.  His bowels are soft.  He has not been doing Sitz baths as recommended at last office visit.  He has been advised to do sitz baths as often as able and use Recticare to the rectum.  He has also been advised to try and use the anusol supp as directed.  He has also taken motrin for the pain with little relief.  His wife did look at his rectum and did not see any swelling or hemorrhoids.  He is certain it is internal.  Dr Chales Abrahams please advise.       Plan: -Colace 100 BID -MiraLAX 17 g p.o. BID with 8oz water.. -Anusol HC supp 1 BID PRN x 10 days, 6RF -Carry on sitz baths PRN for now. -Good perirectal hygiene, can use unscented baby wipes, keep the perirectal area clean and dry. -Minimize nonsteroidals. -Call if any problems.  If still with problems, proceed with colon followed by repeat Sx eval, only if needed. -Otherwise routine colonoscopy at age 37 for CRC screening. -D/W pt and wife in detail.  All contact numbers were given.

## 2023-03-16 NOTE — Telephone Encounter (Signed)
Diltiazem 2% with lidocaine ointment -apply TID for 8 weeks as instructed at clinic visit.  Proceed with colon with miralax  RG

## 2023-03-16 NOTE — Telephone Encounter (Signed)
As per last note Proceed with colonoscopy with MiraLAX prep RG

## 2023-03-17 NOTE — Telephone Encounter (Signed)
See phone note

## 2023-03-22 NOTE — Telephone Encounter (Signed)
Left message for pt to call back  °

## 2023-03-29 NOTE — Telephone Encounter (Signed)
Spoke to pt wife Morrie Sheldon.  Morrie Sheldon stated that he is doing so much better now. Pain is nothing like it was. Morrie Sheldon stated that they would hold off on scheduling the Colonoscopy for now but if his pain returns then they will definitely contact us.  Morrie Sheldon verbalized understanding with all questions answered.

## 2023-03-30 ENCOUNTER — Other Ambulatory Visit: Payer: Self-pay | Admitting: Family Medicine

## 2023-03-30 DIAGNOSIS — E291 Testicular hypofunction: Secondary | ICD-10-CM

## 2023-04-15 ENCOUNTER — Ambulatory Visit: Payer: No Typology Code available for payment source | Admitting: Family Medicine

## 2023-04-15 VITALS — BP 130/78 | HR 88 | Temp 97.2°F | Resp 18 | Ht 71.0 in | Wt 334.0 lb

## 2023-04-15 DIAGNOSIS — R5383 Other fatigue: Secondary | ICD-10-CM

## 2023-04-15 DIAGNOSIS — G4733 Obstructive sleep apnea (adult) (pediatric): Secondary | ICD-10-CM | POA: Diagnosis not present

## 2023-04-15 DIAGNOSIS — E298 Other testicular dysfunction: Secondary | ICD-10-CM

## 2023-04-15 DIAGNOSIS — F3131 Bipolar disorder, current episode depressed, mild: Secondary | ICD-10-CM

## 2023-04-15 MED ORDER — CARIPRAZINE HCL 1.5 MG PO CAPS: 1.5000 mg | ORAL_CAPSULE | Freq: Every day | ORAL | 0 refills | Status: DC

## 2023-04-15 NOTE — Patient Instructions (Addendum)
Stop the abilify, and start on vraylar 1.5 mg daily.  Continue zoloft.  Call back in 3 weeks and let us know how you are doing.

## 2023-04-15 NOTE — Progress Notes (Signed)
Subjective:  Patient ID: Mitchell Booker, male    DOB: 02-09-1981  Age: 42 y.o. MRN: [id number]  Chief Complaint  Patient presents with   Medical Management of Chronic Issues    HPI Sleep apnea:  wears CPAP. Sluggish. If busy, does not get sleepy, but if not, will fall asleep.   Mild depressed bipolar 2 disorder (HCC) - on abilify 5 mg before bed and zoloft 50 mg daily.  Patient is not having panic attacks.  His anxiety is greatly improved.    Hypogonadism: on testosterone shots 200 mg one every 2 weeks.   Not eating healthy or exercising.      04/15/2023    3:19 PM 07/20/2022    3:30 PM 04/16/2022    3:42 PM 02/03/2022    3:38 PM 01/11/2022    2:47 PM  Depression screen PHQ 2/9  Decreased Interest 1 0 1 1 3   Down, Depressed, Hopeless 0 0 1 1 3   PHQ - 2 Score 1 0 2 2 6   Altered sleeping 0 1 3 0 0  Tired, decreased energy 1 1 3 1 3   Change in appetite 0 0 0 0 0  Feeling bad or failure about yourself  0 0 1 1 3   Trouble concentrating 0 0 0 0 0  Moving slowly or fidgety/restless 0 0 0 0 3  Suicidal thoughts 0 0 0 1 2  PHQ-9 Score 2 2 9 5 17   Difficult doing work/chores Not difficult at all Not difficult at all Somewhat difficult Not difficult at all Somewhat difficult        04/15/2023    3:19 PM  Fall Risk   Falls in the past year? 0  Number falls in past yr: 0  Injury with Fall? 0  Risk for fall due to : No Fall Risks  Follow up Falls evaluation completed;Falls prevention discussed    Patient Care Team: Blane Ohara, MD as PCP - General (Internal Medicine)   Review of Systems  Constitutional:  Positive for fatigue. Negative for chills and fever.  HENT:  Negative for congestion, rhinorrhea and sore throat.   Respiratory:  Negative for cough and shortness of breath.   Cardiovascular:  Negative for chest pain and palpitations.  Gastrointestinal:  Negative for abdominal pain, constipation, diarrhea, nausea and vomiting.  Genitourinary:  Negative for dysuria and urgency.   Musculoskeletal:  Negative for arthralgias, back pain and myalgias.  Neurological:  Negative for dizziness and headaches.  Psychiatric/Behavioral:  Negative for decreased concentration and dysphoric mood. The patient is not nervous/anxious.     Current Outpatient Medications on File Prior to Visit  Medication Sig Dispense Refill   cetirizine (ZYRTEC) 10 MG tablet Take 10 mg by mouth daily.     docusate sodium (COLACE) 100 MG capsule Take 200 mg by mouth daily.     hydrocortisone (ANUSOL-HC) 25 MG suppository Place 1 suppository (25 mg total) rectally 2 (two) times daily as needed for hemorrhoids or anal itching. 20 suppository 6   polyethylene glycol (MIRALAX / GLYCOLAX) 17 g packet Take 34 g by mouth daily.     sertraline (ZOLOFT) 50 MG tablet Take 1 tablet (50 mg total) by mouth daily. 90 tablet 1   testosterone cypionate (DEPOTESTOSTERONE CYPIONATE) 200 MG/ML injection INJECT 1 ML IN THE MUSCLE EVERY 14 DAYS 10 mL 0   No current facility-administered medications on file prior to visit.   Past Medical History:  Diagnosis Date   Anxiety    Essential hypertension 02/20/2020  GERD (gastroesophageal reflux disease)    Mixed hyperlipidemia 02/20/2020   Past Surgical History:  Procedure Laterality Date   KNEE ARTHROSCOPY WITH ANTERIOR CRUCIATE LIGAMENT (ACL) REPAIR Left 05/22/2020   Procedure: KNEE ARTHROSCOPY WITH ANTERIOR CRUCIATE LIGAMENT (ACL) REPAIR;  Surgeon: Bjorn Pippin, MD;  Location: [facility];  Service: Orthopedics;  Laterality: Left;   KNEE ARTHROSCOPY WITH MENISCAL REPAIR Left 05/22/2020   Procedure: LEFT KNEE ARTHROSCOPY WITH MEDIAL MENISCAL REPAIR;  Surgeon: Bjorn Pippin, MD;  Location: [facility];  Service: Orthopedics;  Laterality: Left;   wisdom teeth      Family History  Problem Relation Age of Onset   Osteoporosis Mother    Colon polyps Mother        benign   Heart disease Father    Emphysema Father    COPD Father    Heart attack  Father    Lymphoma Father    Diabetes Maternal Grandfather    Colon cancer Neg Hx    Esophageal cancer Neg Hx    Prostate cancer Neg Hx    Rectal cancer Neg Hx    Social History   Socioeconomic History   Marital status: Married    Spouse name: Not on file   Number of children: 1   Years of education: Not on file   Highest education level: Not on file  Occupational History   Occupation: General labor    Employer: [employer]  Tobacco Use   Smoking status: Former    Packs/day: 3.00    Years: 4.00    Additional pack years: 0.00    Total pack years: 12.00    Types: Cigarettes    Quit date: 2003    Years since quitting: 21.3   Smokeless tobacco: Current    Types: Chew   Tobacco comments:    1 can a day  Vaping Use   Vaping Use: Never used  Substance and Sexual Activity   Alcohol use: Not Currently   Drug use: Never   Sexual activity: Yes    Partners: Female  Other Topics Concern   Not on file  Social History Narrative   Not on file   Social Determinants of Health   Financial Resource Strain: Not on file  Food Insecurity: Not on file  Transportation Needs: Not on file  Physical Activity: Not on file  Stress: Not on file  Social Connections: Not on file    Objective:  BP 130/78   Pulse 88   Temp (!) 97.2 F (36.2 C)   Resp 18   Ht 5\' 11"  (1.803 m)   Wt (!) 334 lb (151.5 kg)   BMI 46.58 kg/m      04/15/2023    3:13 PM 02/10/2023   10:27 AM 10/14/2022    3:36 PM  BP/Weight  Systolic BP 130 102 128  Diastolic BP 78 66 72  Wt. (Lbs) 334 335.2 335.8  BMI 46.58 kg/m2 46.75 kg/m2 46.83 kg/m2    Physical Exam Vitals reviewed.  Constitutional:      Appearance: Normal appearance. He is obese.  Neck:     Vascular: No carotid bruit.  Cardiovascular:     Rate and Rhythm: Normal rate and regular rhythm.     Heart sounds: Normal heart sounds.  Pulmonary:     Effort: Pulmonary effort is normal.     Breath sounds: Normal breath sounds. No wheezing,  rhonchi or rales.  Abdominal:     General: Bowel sounds  are normal.     Palpations: Abdomen is soft.     Tenderness: There is no abdominal tenderness.  Neurological:     Mental Status: He is alert and oriented to person, place, and time.  Psychiatric:        Mood and Affect: Mood normal.        Behavior: Behavior normal.     Diabetic Foot Exam - Simple   No data filed      Lab Results  Component Value Date   WBC 7.8 07/21/2022   HGB 16.3 07/21/2022   HCT 47.8 07/21/2022   PLT 246 07/21/2022   GLUCOSE 95 07/21/2022   CHOL 163 01/08/2022   TRIG 142 01/08/2022   HDL 37 (L) 01/08/2022   LDLCALC 101 (H) 01/08/2022   ALT 39 07/21/2022   AST 27 07/21/2022   NA 140 07/21/2022   K 4.7 07/21/2022   CL 105 07/21/2022   CREATININE 1.30 (H) 07/21/2022   BUN 11 07/21/2022   CO2 21 07/21/2022   TSH 3.120 01/08/2022      Assessment & Plan:    OSA treated with BiPAP Assessment & Plan: Continue use of bipap.    Mild depressed bipolar I disorder (HCC) Assessment & Plan: Stop the abilify, and start on vraylar 1.5 mg daily.  Continue zoloft.    Orders: -     Cariprazine HCl; Take 1 capsule (1.5 mg total) by mouth daily.  Dispense: 28 capsule; Refill: 0  Other fatigue Assessment & Plan: Check labs.  Orders: -     CBC with Differential/Platelet -     Comprehensive metabolic panel -     TSH  Other testicular dysfunction Assessment & Plan: Check labs.  Orders: -     Testosterone,Free and Total     Meds ordered this encounter  Medications   cariprazine (VRAYLAR) 1.5 MG capsule    Sig: Take 1 capsule (1.5 mg total) by mouth daily.    Dispense:  28 capsule    Refill:  0    Orders Placed This Encounter  Procedures   CBC with Differential/Platelet   Comprehensive metabolic panel   TSH   Testosterone,Free and Total     Follow-up: Return in about 3 months (around 07/16/2023) for chronic follow up.   I,Carolyn M Morrison,acting as a Neurosurgeon for Blane Ohara,  MD.,have documented all relevant documentation on the behalf of Blane Ohara, MD,as directed by  Blane Ohara, MD while in the presence of Blane Ohara, MD.   Clayborn Bigness I Leal-Borjas,acting as a scribe for Blane Ohara, MD.,have documented all relevant documentation on the behalf of Blane Ohara, MD,as directed by  Blane Ohara, MD while in the presence of Blane Ohara, MD.    An After Visit Summary was printed and given to the patient. I attest that I have reviewed this visit and agree with the plan scribed by my staff.   Blane Ohara, MD [facility] [phone]

## 2023-04-19 DIAGNOSIS — E298 Other testicular dysfunction: Secondary | ICD-10-CM | POA: Insufficient documentation

## 2023-04-19 NOTE — Assessment & Plan Note (Signed)
Check labs 

## 2023-04-19 NOTE — Assessment & Plan Note (Signed)
Stop the abilify, and start on vraylar 1.5 mg daily.  Continue zoloft.

## 2023-04-19 NOTE — Assessment & Plan Note (Signed)
Continue use of bipap.  

## 2023-04-20 ENCOUNTER — Encounter: Payer: Self-pay | Admitting: Family Medicine

## 2023-04-20 ENCOUNTER — Other Ambulatory Visit: Payer: Self-pay | Admitting: Family Medicine

## 2023-04-20 ENCOUNTER — Other Ambulatory Visit: Payer: Self-pay

## 2023-04-20 DIAGNOSIS — F3181 Bipolar II disorder: Secondary | ICD-10-CM

## 2023-04-21 LAB — TESTOSTERONE,FREE AND TOTAL
Testosterone, Free: 5.1 pg/mL — ABNORMAL LOW (ref 8.7–25.1)
Testosterone: 433 ng/dL (ref 264–916)

## 2023-04-21 LAB — CBC WITH DIFFERENTIAL/PLATELET
Basophils Absolute: 0.1 10*3/uL (ref 0.0–0.2)
Basos: 1 %
EOS (ABSOLUTE): 0.2 10*3/uL (ref 0.0–0.4)
Eos: 1 %
Hematocrit: 48.9 % (ref 37.5–51.0)
Hemoglobin: 16.6 g/dL (ref 13.0–17.7)
Immature Grans (Abs): 0.1 10*3/uL (ref 0.0–0.1)
Immature Granulocytes: 1 %
Lymphocytes Absolute: 3.5 10*3/uL — ABNORMAL HIGH (ref 0.7–3.1)
Lymphs: 31 %
MCH: 29.5 pg (ref 26.6–33.0)
MCHC: 33.9 g/dL (ref 31.5–35.7)
MCV: 87 fL (ref 79–97)
Monocytes Absolute: 0.9 10*3/uL (ref 0.1–0.9)
Monocytes: 8 %
Neutrophils Absolute: 6.4 10*3/uL (ref 1.4–7.0)
Neutrophils: 58 %
Platelets: 269 10*3/uL (ref 150–450)
RBC: 5.62 x10E6/uL (ref 4.14–5.80)
RDW: 13.6 % (ref 11.6–15.4)
WBC: 11.1 10*3/uL — ABNORMAL HIGH (ref 3.4–10.8)

## 2023-04-21 LAB — COMPREHENSIVE METABOLIC PANEL
ALT: 38 IU/L (ref 0–44)
AST: 28 IU/L (ref 0–40)
Albumin/Globulin Ratio: 1.6 (ref 1.2–2.2)
Albumin: 4.4 g/dL (ref 4.1–5.1)
Alkaline Phosphatase: 93 IU/L (ref 44–121)
BUN/Creatinine Ratio: 8 — ABNORMAL LOW (ref 9–20)
BUN: 10 mg/dL (ref 6–20)
Bilirubin Total: 0.6 mg/dL (ref 0.0–1.2)
CO2: 24 mmol/L (ref 20–29)
Calcium: 9.4 mg/dL (ref 8.7–10.2)
Chloride: 103 mmol/L (ref 96–106)
Creatinine, Ser: 1.29 mg/dL — ABNORMAL HIGH (ref 0.76–1.27)
Globulin, Total: 2.8 g/dL (ref 1.5–4.5)
Glucose: 76 mg/dL (ref 70–99)
Potassium: 4.5 mmol/L (ref 3.5–5.2)
Sodium: 144 mmol/L (ref 134–144)
Total Protein: 7.2 g/dL (ref 6.0–8.5)
eGFR: 72 mL/min/{1.73_m2} (ref >59)

## 2023-04-21 LAB — TSH: TSH: 4.41 u[IU]/mL (ref 0.450–4.500)

## 2023-04-27 ENCOUNTER — Other Ambulatory Visit: Payer: Self-pay | Admitting: Family Medicine

## 2023-04-27 DIAGNOSIS — F3181 Bipolar II disorder: Secondary | ICD-10-CM

## 2023-05-12 ENCOUNTER — Other Ambulatory Visit: Payer: Self-pay | Admitting: Family Medicine

## 2023-05-12 DIAGNOSIS — E298 Other testicular dysfunction: Secondary | ICD-10-CM

## 2023-05-24 ENCOUNTER — Encounter: Payer: Self-pay | Admitting: Family Medicine

## 2023-06-17 ENCOUNTER — Telehealth: Payer: Self-pay

## 2023-06-17 ENCOUNTER — Other Ambulatory Visit: Payer: Self-pay

## 2023-06-17 DIAGNOSIS — F3131 Bipolar disorder, current episode depressed, mild: Secondary | ICD-10-CM

## 2023-06-17 MED ORDER — CARIPRAZINE HCL 1.5 MG PO CAPS
1.5000 mg | ORAL_CAPSULE | Freq: Every day | ORAL | 1 refills | Status: DC
Start: 2023-06-17 — End: 2023-07-29

## 2023-06-17 NOTE — Telephone Encounter (Signed)
PA submitted via promptpa for Vraylar 1.5 mg on 05/25/23. Spoke with [place] rep for [employer], patient is no longer covered by this plan which is why the PA was canceled on 05/26/23. Spoke with Morrie Sheldon, patients wife, who confirmed this and states when patient comes by our office today to pick up vraylar samples he will provide his new insurance card Monia Pouch).

## 2023-06-17 NOTE — Telephone Encounter (Signed)
PA initiated via covermymeds for Vraylar 1.5 mg. [id number] "Your PA has been resolved, no additional PA is required."

## 2023-07-20 ENCOUNTER — Ambulatory Visit: Payer: No Typology Code available for payment source | Admitting: Family Medicine

## 2023-07-29 ENCOUNTER — Other Ambulatory Visit: Payer: Self-pay | Admitting: Family Medicine

## 2023-07-29 DIAGNOSIS — F3131 Bipolar disorder, current episode depressed, mild: Secondary | ICD-10-CM

## 2023-08-27 ENCOUNTER — Other Ambulatory Visit: Payer: Self-pay | Admitting: Family Medicine

## 2023-08-27 DIAGNOSIS — E291 Testicular hypofunction: Secondary | ICD-10-CM

## 2023-09-29 NOTE — Progress Notes (Unsigned)
Subjective:  Patient ID: Mitchell Booker, male    DOB: 05-14-1981  Age: 42 y.o. MRN: [id number]  No chief complaint on file.   HPI  Well Adult Physical: Patient here for a comprehensive physical exam.The patient reports {problems:16946} Do you take any herbs or supplements that were not prescribed by a doctor? {yes/no/not asked:9010} Are you taking calcium supplements? {yes/no:63} Are you taking aspirin daily? {yes/no:63}  Encounter for general adult medical examination without abnormal findings  Physical ("At Risk" items are starred): Patient's last physical exam was 1 year ago .  Patient wears a seat belt, has smoke detectors, has carbon monoxide detectors, practices appropriate gun safety, and wears sunscreen with extended sun exposure. Dental Care: biannual cleanings, brushes and flosses daily. Ophthalmology/Optometry: Annual visit.  Hearing loss: none Vision impairments: none Last PSA:     04/15/2023    3:19 PM 07/20/2022    3:30 PM 04/16/2022    3:42 PM 02/03/2022    3:38 PM 01/11/2022    2:47 PM  Depression screen PHQ 2/9  Decreased Interest 1 0 1 1 3   Down, Depressed, Hopeless 0 0 1 1 3   PHQ - 2 Score 1 0 2 2 6   Altered sleeping 0 1 3 0 0  Tired, decreased energy 1 1 3 1 3   Change in appetite 0 0 0 0 0  Feeling bad or failure about yourself  0 0 1 1 3   Trouble concentrating 0 0 0 0 0  Moving slowly or fidgety/restless 0 0 0 0 3  Suicidal thoughts 0 0 0 1 2  PHQ-9 Score 2 2 9 5 17   Difficult doing work/chores Not difficult at all Not difficult at all Somewhat difficult Not difficult at all Somewhat difficult         05/16/2020    3:10 PM 05/22/2020   10:10 AM 04/15/2023    3:19 PM  Fall Risk  Falls in the past year? 0  0  Was there an injury with Fall? 0  0  Fall Risk Category Calculator 0  0  Fall Risk Category (Retired) Low    (RETIRED) Patient Fall Risk Level Low fall risk Low fall risk   Patient at Risk for Falls Due to   No Fall Risks  Fall risk Follow up Falls  evaluation completed  Falls evaluation completed;Falls prevention discussed              Past Medical History:  Diagnosis Date   Anxiety    Essential hypertension 02/20/2020   GERD (gastroesophageal reflux disease)    Mixed hyperlipidemia 02/20/2020   Past Surgical History:  Procedure Laterality Date   KNEE ARTHROSCOPY WITH ANTERIOR CRUCIATE LIGAMENT (ACL) REPAIR Left 05/22/2020   Procedure: KNEE ARTHROSCOPY WITH ANTERIOR CRUCIATE LIGAMENT (ACL) REPAIR;  Surgeon: Bjorn Pippin, MD;  Location: [facility];  Service: Orthopedics;  Laterality: Left;   KNEE ARTHROSCOPY WITH MENISCAL REPAIR Left 05/22/2020   Procedure: LEFT KNEE ARTHROSCOPY WITH MEDIAL MENISCAL REPAIR;  Surgeon: Bjorn Pippin, MD;  Location: [facility];  Service: Orthopedics;  Laterality: Left;   wisdom teeth      Family History  Problem Relation Age of Onset   Osteoporosis Mother    Colon polyps Mother        benign   Heart disease Father    Emphysema Father    COPD Father    Heart attack Father    Lymphoma Father    Diabetes Maternal Grandfather  Colon cancer Neg Hx    Esophageal cancer Neg Hx    Prostate cancer Neg Hx    Rectal cancer Neg Hx    Social History   Socioeconomic History   Marital status: Married    Spouse name: Not on file   Number of children: 1   Years of education: Not on file   Highest education level: Not on file  Occupational History   Occupation: General labor    Employer: [employer]  Tobacco Use   Smoking status: Former    Current packs/day: 0.00    Average packs/day: 3.0 packs/day for 4.0 years (12.0 ttl pk-yrs)    Types: Cigarettes    Start date: 49    Quit date: 2003    Years since quitting: 21.8   Smokeless tobacco: Current    Types: Chew   Tobacco comments:    1 can a day  Vaping Use   Vaping status: Never Used  Substance and Sexual Activity   Alcohol use: Not Currently   Drug use: Never   Sexual activity: Yes    Partners:  Female  Other Topics Concern   Not on file  Social History Narrative   Not on file   Social Determinants of Health   Financial Resource Strain: Not on file  Food Insecurity: Not on file  Transportation Needs: Not on file  Physical Activity: Not on file  Stress: Not on file  Social Connections: Not on file   Review of Systems   Objective:  There were no vitals taken for this visit.     04/15/2023    3:13 PM 02/10/2023   10:27 AM 10/14/2022    3:36 PM  BP/Weight  Systolic BP 130 102 128  Diastolic BP 78 66 72  Wt. (Lbs) 334 335.2 335.8  BMI 46.58 kg/m2 46.75 kg/m2 46.83 kg/m2    Physical Exam  Lab Results  Component Value Date   WBC 11.1 (H) 04/15/2023   HGB 16.6 04/15/2023   HCT 48.9 04/15/2023   PLT 269 04/15/2023   GLUCOSE 76 04/15/2023   CHOL 163 01/08/2022   TRIG 142 01/08/2022   HDL 37 (L) 01/08/2022   LDLCALC 101 (H) 01/08/2022   ALT 38 04/15/2023   AST 28 04/15/2023   NA 144 04/15/2023   K 4.5 04/15/2023   CL 103 04/15/2023   CREATININE 1.29 (H) 04/15/2023   BUN 10 04/15/2023   CO2 24 04/15/2023   TSH 4.410 04/15/2023      Assessment & Plan:  There are no diagnoses linked to this encounter.   There is no height or weight on file to calculate BMI.   These are the goals we discussed:  Goals   None      This is a list of the screening recommended for you and due dates:  Health Maintenance  Topic Date Due   HIV Screening  Never done   Hepatitis C Screening  Never done   DTaP/Tdap/Td vaccine (1 - Tdap) Never done   Flu Shot  07/01/2023   COVID-19 Vaccine (1 - 2023-24 season) Never done   HPV Vaccine  Aged Out     No orders of the defined types were placed in this encounter.    Follow-up: No follow-ups on file.  An After Visit Summary was printed and given to the patient.  Blane Ohara, MD [facility] [phone]

## 2023-09-30 ENCOUNTER — Encounter: Payer: Self-pay | Admitting: Family Medicine

## 2023-09-30 ENCOUNTER — Ambulatory Visit (INDEPENDENT_AMBULATORY_CARE_PROVIDER_SITE_OTHER): Payer: 59 | Admitting: Family Medicine

## 2023-09-30 VITALS — BP 136/82 | HR 62 | Temp 97.8°F | Ht 71.0 in | Wt 342.0 lb

## 2023-09-30 DIAGNOSIS — E298 Other testicular dysfunction: Secondary | ICD-10-CM

## 2023-09-30 DIAGNOSIS — Z114 Encounter for screening for human immunodeficiency virus [HIV]: Secondary | ICD-10-CM | POA: Diagnosis not present

## 2023-09-30 DIAGNOSIS — Z1159 Encounter for screening for other viral diseases: Secondary | ICD-10-CM | POA: Insufficient documentation

## 2023-09-30 DIAGNOSIS — Z0000 Encounter for general adult medical examination without abnormal findings: Secondary | ICD-10-CM | POA: Insufficient documentation

## 2023-09-30 DIAGNOSIS — G4733 Obstructive sleep apnea (adult) (pediatric): Secondary | ICD-10-CM

## 2023-09-30 DIAGNOSIS — Z23 Encounter for immunization: Secondary | ICD-10-CM

## 2023-09-30 NOTE — Assessment & Plan Note (Signed)
Check levels.  The current medical regimen is effective;  continue present plan and medications.

## 2023-09-30 NOTE — Assessment & Plan Note (Signed)
Continue cpap use. Benefits and is compliant.

## 2023-09-30 NOTE — Patient Instructions (Signed)

## 2023-10-01 LAB — LIPID PANEL
Chol/HDL Ratio: 5.1 ratio — ABNORMAL HIGH (ref 0.0–5.0)
Cholesterol, Total: 153 mg/dL (ref 100–199)
HDL: 30 mg/dL — ABNORMAL LOW (ref >39)
LDL Chol Calc (NIH): 105 mg/dL — ABNORMAL HIGH (ref 0–99)
Triglycerides: 96 mg/dL (ref 0–149)
VLDL Cholesterol Cal: 18 mg/dL (ref 5–40)

## 2023-10-01 LAB — CBC WITH DIFFERENTIAL/PLATELET
Basophils Absolute: 0.1 10*3/uL (ref 0.0–0.2)
Basos: 1 %
EOS (ABSOLUTE): 0.1 10*3/uL (ref 0.0–0.4)
Eos: 1 %
Hematocrit: 48.7 % (ref 37.5–51.0)
Hemoglobin: 16.3 g/dL (ref 13.0–17.7)
Immature Grans (Abs): 0 10*3/uL (ref 0.0–0.1)
Immature Granulocytes: 0 %
Lymphocytes Absolute: 2.3 10*3/uL (ref 0.7–3.1)
Lymphs: 30 %
MCH: 29.7 pg (ref 26.6–33.0)
MCHC: 33.5 g/dL (ref 31.5–35.7)
MCV: 89 fL (ref 79–97)
Monocytes Absolute: 0.5 10*3/uL (ref 0.1–0.9)
Monocytes: 6 %
Neutrophils Absolute: 4.9 10*3/uL (ref 1.4–7.0)
Neutrophils: 62 %
Platelets: 253 10*3/uL (ref 150–450)
RBC: 5.48 x10E6/uL (ref 4.14–5.80)
RDW: 13.3 % (ref 11.6–15.4)
WBC: 7.9 10*3/uL (ref 3.4–10.8)

## 2023-10-01 LAB — COMPREHENSIVE METABOLIC PANEL
ALT: 40 [IU]/L (ref 0–44)
AST: 29 [IU]/L (ref 0–40)
Albumin: 4.2 g/dL (ref 4.1–5.1)
Alkaline Phosphatase: 78 [IU]/L (ref 44–121)
BUN/Creatinine Ratio: 10 (ref 9–20)
BUN: 13 mg/dL (ref 6–20)
Bilirubin Total: 0.6 mg/dL (ref 0.0–1.2)
CO2: 21 mmol/L (ref 20–29)
Calcium: 9.2 mg/dL (ref 8.7–10.2)
Chloride: 106 mmol/L (ref 96–106)
Creatinine, Ser: 1.25 mg/dL (ref 0.76–1.27)
Globulin, Total: 2.4 g/dL (ref 1.5–4.5)
Glucose: 98 mg/dL (ref 70–99)
Potassium: 4.5 mmol/L (ref 3.5–5.2)
Sodium: 140 mmol/L (ref 134–144)
Total Protein: 6.6 g/dL (ref 6.0–8.5)
eGFR: 75 mL/min/{1.73_m2} (ref >59)

## 2023-10-01 LAB — HCV AB W REFLEX TO QUANT PCR: HCV Ab: NONREACTIVE

## 2023-10-01 LAB — HCV INTERPRETATION

## 2023-10-01 LAB — HIV ANTIBODY (ROUTINE TESTING W REFLEX): HIV Screen 4th Generation wRfx: NONREACTIVE

## 2023-10-03 LAB — TESTOSTERONE,FREE AND TOTAL
Testosterone, Free: 10.5 pg/mL (ref 8.7–25.1)
Testosterone: 605 ng/dL (ref 264–916)

## 2023-12-12 ENCOUNTER — Other Ambulatory Visit: Payer: Self-pay | Admitting: Family Medicine

## 2023-12-12 DIAGNOSIS — F3181 Bipolar II disorder: Secondary | ICD-10-CM

## 2024-03-30 ENCOUNTER — Other Ambulatory Visit: Payer: Self-pay | Admitting: Family Medicine

## 2024-03-30 DIAGNOSIS — E291 Testicular hypofunction: Secondary | ICD-10-CM

## 2024-03-31 ENCOUNTER — Other Ambulatory Visit: Payer: Self-pay | Admitting: Family Medicine

## 2024-03-31 DIAGNOSIS — E291 Testicular hypofunction: Secondary | ICD-10-CM

## 2024-04-11 ENCOUNTER — Ambulatory Visit: Payer: Self-pay

## 2024-04-11 NOTE — Progress Notes (Signed)
 Subjective:  Patient ID: Mitchell Booker, male    DOB: August 09, 1981  Age: 43 y.o. MRN: [id number]  Chief Complaint  Patient presents with   Depression    HPI: Depression: Zoloft  50 mg daily. Failed Vraylar . Father suffered from depression but was never treated. Wife states patient does not express interest in things, sleeps a lot, does not enjoy fishing like he use to, very quiet or interacts like he use too. Patient states he does not feel as though he is depressed. Does not have suicidal thoughts however expresses, "If I was to get into a wreck on the way home today, I wouldn't care. Just another damn day."      04/12/2024    4:14 PM 09/30/2023    8:09 AM 04/15/2023    3:19 PM 07/20/2022    3:30 PM 04/16/2022    3:42 PM  Depression screen PHQ 2/9  Decreased Interest 1 0 1 0 1  Down, Depressed, Hopeless 1 0 0 0 1  PHQ - 2 Score 2 0 1 0 2  Altered sleeping 0 0 0 1 3  Tired, decreased energy 1 1 1 1 3   Change in appetite 0 0 0 0 0  Feeling bad or failure about yourself  1 0 0 0 1  Trouble concentrating 0 0 0 0 0  Moving slowly or fidgety/restless 0 0 0 0 0  Suicidal thoughts 0 0 0 0 0  PHQ-9 Score 4 1 2 2 9   Difficult doing work/chores Not difficult at all Not difficult at all Not difficult at all Not difficult at all Somewhat difficult        09/30/2023    8:08 AM  Fall Risk   Falls in the past year? 0  Number falls in past yr: 0  Injury with Fall? 0  Risk for fall due to : No Fall Risks  Follow up Falls evaluation completed      04/12/2024    4:14 PM 09/30/2023    8:09 AM 04/15/2023    3:21 PM 02/03/2022    3:40 PM  GAD 7 : Generalized Anxiety Score  Nervous, Anxious, on Edge 0 0 0 1  Control/stop worrying 0 0 0 0  Worry too much - different things 0 0 0 0  Trouble relaxing 0 0 0 1  Restless 0 0 0 1  Easily annoyed or irritable 3 0 0 0  Afraid - awful might happen 0 0 0 0  Total GAD 7 Score 3 0 0 3  Anxiety Difficulty Not difficult at all Not difficult at all Not  difficult at all Not difficult at all     Patient Care Team: Mercy Stall, MD as PCP - General (Internal Medicine)   Review of Systems  Constitutional:  Negative for chills, diaphoresis, fatigue and fever.  HENT:  Negative for congestion.   Respiratory:  Negative for cough and shortness of breath.   Cardiovascular:  Negative for chest pain and leg swelling.  Neurological:  Negative for dizziness and headaches.    Current Outpatient Medications on File Prior to Visit  Medication Sig Dispense Refill   cetirizine  (ZYRTEC ) 10 MG tablet Take 10 mg by mouth daily.     docusate sodium (COLACE) 100 MG capsule Take 200 mg by mouth daily.     hydrocortisone  (ANUSOL -HC) 25 MG suppository Place 1 suppository (25 mg total) rectally 2 (two) times daily as needed for hemorrhoids or anal itching. 20 suppository 6   polyethylene glycol (MIRALAX  /  GLYCOLAX ) 17 g packet Take 34 g by mouth daily.     sertraline  (ZOLOFT ) 50 MG tablet TAKE 1 TABLET(50 MG) BY MOUTH DAILY 90 tablet 1   testosterone  cypionate (DEPOTESTOSTERONE CYPIONATE) 200 MG/ML injection ADMINISTER 1 ML IN THE MUSCLE EVERY 14 DAYS 10 mL 0   No current facility-administered medications on file prior to visit.   Past Medical History:  Diagnosis Date   Anxiety    Essential hypertension 02/20/2020   GERD (gastroesophageal reflux disease)    Mixed hyperlipidemia 02/20/2020   Past Surgical History:  Procedure Laterality Date   KNEE ARTHROSCOPY WITH ANTERIOR CRUCIATE LIGAMENT (ACL) REPAIR Left 05/22/2020   Procedure: KNEE ARTHROSCOPY WITH ANTERIOR CRUCIATE LIGAMENT (ACL) REPAIR;  Surgeon: Micheline Ahr, MD;  Location: [facility];  Service: Orthopedics;  Laterality: Left;   KNEE ARTHROSCOPY WITH MENISCAL REPAIR Left 05/22/2020   Procedure: LEFT KNEE ARTHROSCOPY WITH MEDIAL MENISCAL REPAIR;  Surgeon: Micheline Ahr, MD;  Location: [facility];  Service: Orthopedics;  Laterality: Left;   wisdom teeth      Family  History  Problem Relation Age of Onset   Osteoporosis Mother    Colon polyps Mother        benign   Heart disease Father    Emphysema Father    COPD Father    Heart attack Father    Lymphoma Father    Diabetes Maternal Grandfather    Colon cancer Neg Hx    Esophageal cancer Neg Hx    Prostate cancer Neg Hx    Rectal cancer Neg Hx    Social History   Socioeconomic History   Marital status: Married    Spouse name: Not on file   Number of children: 1   Years of education: Not on file   Highest education level: Not on file  Occupational History   Occupation: General labor    Employer: [employer]  Tobacco Use   Smoking status: Former    Current packs/day: 0.00    Average packs/day: 3.0 packs/day for 4.0 years (12.0 ttl pk-yrs)    Types: Cigarettes    Start date: 1999    Quit date: 2003    Years since quitting: 22.3   Smokeless tobacco: Current    Types: Chew   Tobacco comments:    1 can a day  Vaping Use   Vaping status: Never Used  Substance and Sexual Activity   Alcohol use: Not Currently   Drug use: Never   Sexual activity: Yes    Partners: Female  Other Topics Concern   Not on file  Social History Narrative   Not on file   Social Drivers of Health   Financial Resource Strain: Low Risk  (09/30/2023)   Overall Financial Resource Strain (CARDIA)    Difficulty of Paying Living Expenses: Not hard at all  Food Insecurity: No Food Insecurity (09/30/2023)   Hunger Vital Sign    Worried About Running Out of Food in the Last Year: Never true    Ran Out of Food in the Last Year: Never true  Transportation Needs: No Transportation Needs (09/30/2023)   PRAPARE - Administrator, Civil Service (Medical): No    Lack of Transportation (Non-Medical): No  Physical Activity: Inactive (09/30/2023)   Exercise Vital Sign    Days of Exercise per Week: 0 days    Minutes of Exercise per Session: 0 min  Stress: No Stress Concern Present (09/30/2023)    [place]  Institute of Occupational Health - Occupational Stress Questionnaire    Feeling of Stress : Not at all  Social Connections: Moderately Integrated (09/30/2023)   Social Connection and Isolation Panel [NHANES]    Frequency of Communication with Friends and Family: More than three times a week    Frequency of Social Gatherings with Friends and Family: More than three times a week    Attends Religious Services: More than 4 times per year    Active Member of [employer] or Organizations: No    Attends Engineer, structural: Never    Marital Status: Married    Objective:  BP (!) 154/100   Pulse (!) 104   Temp 98 F (36.7 C)   Ht 5\' 11"  (1.803 m)   Wt (!) 345 lb (156.5 kg)   SpO2 99%   BMI 48.12 kg/m      04/12/2024    4:10 PM 09/30/2023    8:02 AM 04/15/2023    3:13 PM  BP/Weight  Systolic BP 154 136 130  Diastolic BP 100 82 78  Wt. (Lbs) 345 342 334  BMI 48.12 kg/m2 47.7 kg/m2 46.58 kg/m2    Physical Exam Vitals reviewed.  Constitutional:      Appearance: Normal appearance. He is obese.  Cardiovascular:     Rate and Rhythm: Normal rate and regular rhythm.     Heart sounds: Normal heart sounds.  Pulmonary:     Effort: Pulmonary effort is normal.     Breath sounds: Normal breath sounds. No wheezing, rhonchi or rales.  Abdominal:     Palpations: Abdomen is soft.     Tenderness: There is no abdominal tenderness.  Neurological:     Mental Status: He is alert and oriented to person, place, and time.  Psychiatric:        Behavior: Behavior normal.     Comments: agitated     Diabetic Foot Exam - Simple   No data filed      Lab Results  Component Value Date   WBC 7.9 09/30/2023   HGB 16.3 09/30/2023   HCT 48.7 09/30/2023   PLT 253 09/30/2023   GLUCOSE 98 09/30/2023   CHOL 153 09/30/2023   TRIG 96 09/30/2023   HDL 30 (L) 09/30/2023   LDLCALC 105 (H) 09/30/2023   ALT 40 09/30/2023   AST 29 09/30/2023   NA 140 09/30/2023   K 4.5 09/30/2023   CL 106  09/30/2023   CREATININE 1.25 09/30/2023   BUN 13 09/30/2023   CO2 21 09/30/2023   TSH 4.410 04/15/2023      Assessment & Plan:  Bipolar disorder with moderate depression (HCC) Assessment & Plan: Continue on zoloft  50 mg daily.  Start wellbutrin  xl 150 mg once daily x 7 days, 150 mg 2 in am.    Elevated BP without diagnosis of hypertension Assessment & Plan: Recheck bp at follow up appt. Patient is very agitated today.    Other orders -     buPROPion  HCl ER (XL); Take 1 tablet (150 mg total) by mouth daily for 7 days, THEN 2 tablets (300 mg total) daily for 23 days.  Dispense: 49 tablet; Refill: 0     Meds ordered this encounter  Medications   buPROPion  (WELLBUTRIN  XL) 150 MG 24 hr tablet    Sig: Take 1 tablet (150 mg total) by mouth daily for 7 days, THEN 2 tablets (300 mg total) daily for 23 days.    Dispense:  49 tablet  Refill:  0    No orders of the defined types were placed in this encounter.    Follow-up: Return in about 4 weeks (around 05/10/2024) for chronic follow up.   I,Marla I Leal-Borjas,acting as a scribe for Mercy Stall, MD.,have documented all relevant documentation on the behalf of Mercy Stall, MD,as directed by  Mercy Stall, MD while in the presence of Mercy Stall, MD.   An After Visit Summary was printed and given to the patient.  I attest that I have reviewed this visit and agree with the plan scribed by my staff.   Mercy Stall, MD [facility] [phone]

## 2024-04-11 NOTE — Telephone Encounter (Signed)
 Copied from CRM [id number]. Topic: Clinical - Red Word Triage >> Apr 11, 2024  8:16 AM Hamdi H wrote: [facility] that prompted transfer to Nurse Triage: Depression  Chief Complaint: depression Symptoms: difficulty sleeping, working, concentrating Frequency: constant Pertinent Negatives: Patient denies si, hi, cold like symptoms Disposition: [] ED /[] Urgent Care (no appt availability in office) / [x] Appointment(In office/virtual)/ []  [place] [facility]/ [] Home Care/ [] Refused Recommended Disposition /[] [place] [facility]/ []  Follow-up with PCP Additional Notes: apt made per protocol; care advice given, denies questions; instructed to go to ER if becomes worse.   Reason for Disposition  [1] Depression AND [2] worsening (e.g., sleeping poorly, less able to do activities of daily living)  Answer Assessment - Initial Assessment Questions 1. CONCERN: "What happened that made you call today?"     depression 2. DEPRESSION SYMPTOM SCREENING: "How are you feeling overall?" (e.g., decreased energy, increased sleeping or difficulty sleeping, difficulty concentrating, feelings of sadness, guilt, hopelessness, or worthlessness)     Decreased energy, sleeping, difficulty concentrating 3. RISK OF HARM - SUICIDAL IDEATION:  "Do you ever have thoughts of hurting or killing yourself?"  (e.g., yes, no, no but preoccupation with thoughts about death)   - INTENT:  "Do you have thoughts of hurting or killing yourself right NOW?" (e.g., yes, no, N/A)   - PLAN: "Do you have a specific plan for how you would do this?" (e.g., gun, knife, overdose, no plan, N/A)     no 4. RISK OF HARM - HOMICIDAL IDEATION:  "Do you ever have thoughts of hurting or killing someone else?"  (e.g., yes, no, no but preoccupation with thoughts about death)   - INTENT:  "Do you have thoughts of hurting or killing someone right NOW?" (e.g., yes, no, N/A)   - PLAN: "Do you have a specific plan for how you would do this?" (e.g., gun, knife,  no plan, N/A)      no 5. FUNCTIONAL IMPAIRMENT: "How have things been going for you overall? Have you had more difficulty than usual doing your normal daily activities?"  (e.g., better, same, worse; self-care, school, work, interactions)     no 6. SUPPORT: "Who is with you now?" "Who do you live with?" "Do you have family or friends who you can talk to?"      wife 7. THERAPIST: "Do you have a counselor or therapist? Name?"     no 8. STRESSORS: "Has there been any new stress or recent changes in your life?"     no 9. ALCOHOL USE OR SUBSTANCE USE (DRUG USE): "Do you drink alcohol or use any illegal drugs?"     no 10. OTHER: "Do you have any other physical symptoms right now?" (e.g., fever)       no 11. PREGNANCY: "Is there any chance you are pregnant?" "When was your last menstrual period?"       na  Protocols used: Depression-A-AH

## 2024-04-12 ENCOUNTER — Ambulatory Visit: Admitting: Family Medicine

## 2024-04-12 VITALS — BP 154/100 | HR 104 | Temp 98.0°F | Ht 71.0 in | Wt 345.0 lb

## 2024-04-12 DIAGNOSIS — F3132 Bipolar disorder, current episode depressed, moderate: Secondary | ICD-10-CM

## 2024-04-12 DIAGNOSIS — R030 Elevated blood-pressure reading, without diagnosis of hypertension: Secondary | ICD-10-CM

## 2024-04-12 MED ORDER — BUPROPION HCL ER (XL) 150 MG PO TB24: ORAL_TABLET | ORAL | 0 refills | Status: DC

## 2024-04-12 NOTE — Patient Instructions (Signed)
 Continue on zoloft  50 mg daily.  Start wellbutrin xl 150 mg once daily x 7 days, 150 mg 2 in am.

## 2024-04-16 ENCOUNTER — Encounter: Payer: Self-pay | Admitting: Family Medicine

## 2024-04-16 DIAGNOSIS — R030 Elevated blood-pressure reading, without diagnosis of hypertension: Secondary | ICD-10-CM | POA: Insufficient documentation

## 2024-04-16 NOTE — Assessment & Plan Note (Signed)
 Continue on zoloft  50 mg daily.  Start wellbutrin xl 150 mg once daily x 7 days, 150 mg 2 in am.

## 2024-04-16 NOTE — Assessment & Plan Note (Signed)
 Recheck bp at follow up appt. Patient is very agitated today.

## 2024-05-07 ENCOUNTER — Other Ambulatory Visit: Payer: Self-pay | Admitting: Family Medicine

## 2024-05-07 MED ORDER — BUPROPION HCL ER (XL) 300 MG PO TB24: 300.0000 mg | ORAL_TABLET | Freq: Every day | ORAL | 2 refills | Status: DC

## 2024-05-09 NOTE — Progress Notes (Unsigned)
 Subjective:  Patient ID: Mitchell Booker, male    DOB: May 03, 1981  Age: 43 y.o. MRN: [id number]  No chief complaint on file.   HPI:     04/12/2024    4:14 PM 09/30/2023    8:09 AM 04/15/2023    3:19 PM 07/20/2022    3:30 PM 04/16/2022    3:42 PM  Depression screen PHQ 2/9  Decreased Interest 1 0 1 0 1  Down, Depressed, Hopeless 1 0 0 0 1  PHQ - 2 Score 2 0 1 0 2  Altered sleeping 0 0 0 1 3  Tired, decreased energy 1 1 1 1 3   Change in appetite 0 0 0 0 0  Feeling bad or failure about yourself  1 0 0 0 1  Trouble concentrating 0 0 0 0 0  Moving slowly or fidgety/restless 0 0 0 0 0  Suicidal thoughts 0 0 0 0 0  PHQ-9 Score 4 1 2 2 9   Difficult doing work/chores Not difficult at all Not difficult at all Not difficult at all Not difficult at all Somewhat difficult        09/30/2023    8:08 AM  Fall Risk   Falls in the past year? 0  Number falls in past yr: 0  Injury with Fall? 0  Risk for fall due to : No Fall Risks  Follow up Falls evaluation completed    Patient Care Team: Mercy Stall, MD as PCP - General (Internal Medicine)   Review of Systems  Current Outpatient Medications on File Prior to Visit  Medication Sig Dispense Refill   buPROPion  (WELLBUTRIN  XL) 300 MG 24 hr tablet Take 1 tablet (300 mg total) by mouth daily. 30 tablet 2   cetirizine  (ZYRTEC ) 10 MG tablet Take 10 mg by mouth daily.     docusate sodium (COLACE) 100 MG capsule Take 200 mg by mouth daily.     hydrocortisone  (ANUSOL -HC) 25 MG suppository Place 1 suppository (25 mg total) rectally 2 (two) times daily as needed for hemorrhoids or anal itching. 20 suppository 6   polyethylene glycol (MIRALAX  / GLYCOLAX ) 17 g packet Take 34 g by mouth daily.     sertraline  (ZOLOFT ) 50 MG tablet TAKE 1 TABLET(50 MG) BY MOUTH DAILY 90 tablet 1   testosterone  cypionate (DEPOTESTOSTERONE CYPIONATE) 200 MG/ML injection ADMINISTER 1 ML IN THE MUSCLE EVERY 14 DAYS 10 mL 0   No current facility-administered medications on  file prior to visit.   Past Medical History:  Diagnosis Date   Anxiety    Essential hypertension 02/20/2020   GERD (gastroesophageal reflux disease)    Mixed hyperlipidemia 02/20/2020   Past Surgical History:  Procedure Laterality Date   KNEE ARTHROSCOPY WITH ANTERIOR CRUCIATE LIGAMENT (ACL) REPAIR Left 05/22/2020   Procedure: KNEE ARTHROSCOPY WITH ANTERIOR CRUCIATE LIGAMENT (ACL) REPAIR;  Surgeon: Micheline Ahr, MD;  Location: [facility];  Service: Orthopedics;  Laterality: Left;   KNEE ARTHROSCOPY WITH MENISCAL REPAIR Left 05/22/2020   Procedure: LEFT KNEE ARTHROSCOPY WITH MEDIAL MENISCAL REPAIR;  Surgeon: Micheline Ahr, MD;  Location: [facility];  Service: Orthopedics;  Laterality: Left;   wisdom teeth      Family History  Problem Relation Age of Onset   Osteoporosis Mother    Colon polyps Mother        benign   Heart disease Father    Emphysema Father    COPD Father    Heart attack Father    Lymphoma  Father    Diabetes Maternal Grandfather    Colon cancer Neg Hx    Esophageal cancer Neg Hx    Prostate cancer Neg Hx    Rectal cancer Neg Hx    Social History   Socioeconomic History   Marital status: Married    Spouse name: Not on file   Number of children: 1   Years of education: Not on file   Highest education level: Not on file  Occupational History   Occupation: General labor    Employer: [employer]  Tobacco Use   Smoking status: Former    Current packs/day: 0.00    Average packs/day: 3.0 packs/day for 4.0 years (12.0 ttl pk-yrs)    Types: Cigarettes    Start date: 57    Quit date: 2003    Years since quitting: 22.4   Smokeless tobacco: Current    Types: Chew   Tobacco comments:    1 can a day  Vaping Use   Vaping status: Never Used  Substance and Sexual Activity   Alcohol use: Not Currently   Drug use: Never   Sexual activity: Yes    Partners: Female  Other Topics Concern   Not on file  Social History Narrative    Not on file   Social Drivers of Health   Financial Resource Strain: Low Risk  (09/30/2023)   Overall Financial Resource Strain (CARDIA)    Difficulty of Paying Living Expenses: Not hard at all  Food Insecurity: No Food Insecurity (09/30/2023)   Hunger Vital Sign    Worried About Running Out of Food in the Last Year: Never true    Ran Out of Food in the Last Year: Never true  Transportation Needs: No Transportation Needs (09/30/2023)   PRAPARE - Administrator, Civil Service (Medical): No    Lack of Transportation (Non-Medical): No  Physical Activity: Inactive (09/30/2023)   Exercise Vital Sign    Days of Exercise per Week: 0 days    Minutes of Exercise per Session: 0 min  Stress: No Stress Concern Present (09/30/2023)   [employer] of Occupational Health - Occupational Stress Questionnaire    Feeling of Stress : Not at all  Social Connections: Moderately Integrated (09/30/2023)   Social Connection and Isolation Panel [NHANES]    Frequency of Communication with Friends and Family: More than three times a week    Frequency of Social Gatherings with Friends and Family: More than three times a week    Attends Religious Services: More than 4 times per year    Active Member of [employer] or Organizations: No    Attends Banker Meetings: Never    Marital Status: Married    Objective:  There were no vitals taken for this visit.     04/12/2024    4:10 PM 09/30/2023    8:02 AM 04/15/2023    3:13 PM  BP/Weight  Systolic BP 154 136 130  Diastolic BP 100 82 78  Wt. (Lbs) 345 342 334  BMI 48.12 kg/m2 47.7 kg/m2 46.58 kg/m2    Physical Exam  Diabetic Foot Exam - Simple   No data filed      Lab Results  Component Value Date   WBC 7.9 09/30/2023   HGB 16.3 09/30/2023   HCT 48.7 09/30/2023   PLT 253 09/30/2023   GLUCOSE 98 09/30/2023   CHOL 153 09/30/2023   TRIG 96 09/30/2023   HDL 30 (L) 09/30/2023   LDLCALC 105 (H)  09/30/2023   ALT 40  09/30/2023   AST 29 09/30/2023   NA 140 09/30/2023   K 4.5 09/30/2023   CL 106 09/30/2023   CREATININE 1.25 09/30/2023   BUN 13 09/30/2023   CO2 21 09/30/2023   TSH 4.410 04/15/2023      Assessment & Plan:  There are no diagnoses linked to this encounter.   No orders of the defined types were placed in this encounter.   No orders of the defined types were placed in this encounter.    Follow-up: No follow-ups on file.   I,Marla I Leal-Borjas,acting as a scribe for Mercy Stall, MD.,have documented all relevant documentation on the behalf of Mercy Stall, MD,as directed by  Mercy Stall, MD while in the presence of Mercy Stall, MD.   An After Visit Summary was printed and given to the patient.  Mercy Stall, MD [facility] [phone]

## 2024-05-10 ENCOUNTER — Encounter: Payer: Self-pay | Admitting: Family Medicine

## 2024-05-10 ENCOUNTER — Ambulatory Visit: Admitting: Family Medicine

## 2024-05-10 VITALS — BP 132/72 | HR 78 | Temp 98.0°F | Ht 71.0 in | Wt 320.0 lb

## 2024-05-10 DIAGNOSIS — F3132 Bipolar disorder, current episode depressed, moderate: Secondary | ICD-10-CM | POA: Diagnosis not present

## 2024-05-10 NOTE — Patient Instructions (Signed)
 On wellbutrin  xl 300 mg once daily.  Stop zoloft .

## 2024-05-10 NOTE — Assessment & Plan Note (Signed)
 On wellbutrin  xl 300 mg once daily. Stop zoloft  to see if this helps libido. He and his wife to let me know how he is doing after the change in medicines. Consider starting on abilify  or possibly restarting zoloft  if needed.

## 2024-06-14 ENCOUNTER — Other Ambulatory Visit: Payer: Self-pay | Admitting: Family Medicine

## 2024-06-14 DIAGNOSIS — F3181 Bipolar II disorder: Secondary | ICD-10-CM

## 2024-06-29 DIAGNOSIS — L578 Other skin changes due to chronic exposure to nonionizing radiation: Secondary | ICD-10-CM | POA: Diagnosis not present

## 2024-06-29 DIAGNOSIS — L821 Other seborrheic keratosis: Secondary | ICD-10-CM | POA: Diagnosis not present

## 2024-07-22 ENCOUNTER — Other Ambulatory Visit: Payer: Self-pay | Admitting: Family Medicine

## 2024-07-29 ENCOUNTER — Other Ambulatory Visit: Payer: Self-pay | Admitting: Family Medicine

## 2024-07-29 DIAGNOSIS — E291 Testicular hypofunction: Secondary | ICD-10-CM

## 2024-08-02 ENCOUNTER — Encounter: Payer: Self-pay | Admitting: Family Medicine

## 2024-08-07 ENCOUNTER — Other Ambulatory Visit: Payer: Self-pay

## 2024-08-09 ENCOUNTER — Telehealth: Payer: Self-pay

## 2024-08-09 NOTE — Telephone Encounter (Signed)
 PA denied due to office notes not being submitted, during phone call with [employer] rep office notes were not requested nor a fax number provided at that time. Office notes and lab results sent to number provided on denial.

## 2024-08-20 NOTE — Progress Notes (Unsigned)
 Subjective:  Patient ID: Mitchell Booker, male    DOB: [birth year]  Age: 43 y.o. MRN: [id number]  No chief complaint on file.   HPI: Discussed the use of AI scribe software for clinical note transcription with the patient, who gave verbal consent to proceed.  History of Present Illness        05/10/2024    9:03 AM 04/12/2024    4:14 PM 09/30/2023    8:09 AM 04/15/2023    3:19 PM 07/20/2022    3:30 PM  Depression screen PHQ 2/9  Decreased Interest 2 1 0 1 0  Down, Depressed, Hopeless 0 1 0 0 0  PHQ - 2 Score 2 2 0 1 0  Altered sleeping 0 0 0 0 1  Tired, decreased energy  1 1 1 1   Change in appetite 0 0 0 0 0  Feeling bad or failure about yourself  0 1 0 0 0  Trouble concentrating 3 0 0 0 0  Moving slowly or fidgety/restless 0 0 0 0 0  Suicidal thoughts 0 0 0 0 0  PHQ-9 Score 5 4 1 2 2   Difficult doing work/chores Not difficult at all Not difficult at all Not difficult at all Not difficult at all Not difficult at all        09/30/2023    8:08 AM  Fall Risk   Falls in the past year? 0  Number falls in past yr: 0  Injury with Fall? 0  Risk for fall due to : No Fall Risks  Follow up Falls evaluation completed    Patient Care Team: Sherre Clapper, MD as PCP - General (Internal Medicine)   Review of Systems  Constitutional:  Negative for chills, fatigue and fever.  HENT:  Negative for congestion, ear pain and sore throat.   Respiratory:  Negative for cough and shortness of breath.   Cardiovascular:  Negative for chest pain.  Gastrointestinal:  Negative for abdominal pain, constipation, diarrhea, nausea and vomiting.  Endocrine: Negative for polydipsia, polyphagia and polyuria.  Genitourinary:  Negative for dysuria and frequency.  Musculoskeletal:  Negative for arthralgias and myalgias.  Neurological:  Negative for dizziness and headaches.  Psychiatric/Behavioral:  Negative for dysphoric mood.        No dysphoria    Current Outpatient Medications on File Prior to Visit   Medication Sig Dispense Refill   buPROPion  (WELLBUTRIN  XL) 300 MG 24 hr tablet TAKE 1 TABLET(300 MG) BY MOUTH DAILY 90 tablet 0   cetirizine  (ZYRTEC ) 10 MG tablet Take 10 mg by mouth daily.     docusate sodium (COLACE) 100 MG capsule Take 200 mg by mouth daily.     hydrocortisone  (ANUSOL -HC) 25 MG suppository Place 1 suppository (25 mg total) rectally 2 (two) times daily as needed for hemorrhoids or anal itching. 20 suppository 6   polyethylene glycol (MIRALAX  / GLYCOLAX ) 17 g packet Take 34 g by mouth daily.     sertraline  (ZOLOFT ) 50 MG tablet TAKE 1 TABLET(50 MG) BY MOUTH DAILY 90 tablet 1   testosterone  cypionate (DEPOTESTOSTERONE CYPIONATE) 200 MG/ML injection INJECT 1 ML IN THE MUSCLE EVERY 14 DAYS 10 mL 1   No current facility-administered medications on file prior to visit.   Past Medical History:  Diagnosis Date   Anxiety    Essential hypertension 02/20/2020   GERD (gastroesophageal reflux disease)    Mixed hyperlipidemia 02/20/2020   Past Surgical History:  Procedure Laterality Date   KNEE ARTHROSCOPY WITH ANTERIOR CRUCIATE LIGAMENT (ACL)  REPAIR Left 05/22/2020   Procedure: KNEE ARTHROSCOPY WITH ANTERIOR CRUCIATE LIGAMENT (ACL) REPAIR;  Surgeon: Cristy Bonner DASEN, MD;  Location: [facility];  Service: Orthopedics;  Laterality: Left;   KNEE ARTHROSCOPY WITH MENISCAL REPAIR Left 05/22/2020   Procedure: LEFT KNEE ARTHROSCOPY WITH MEDIAL MENISCAL REPAIR;  Surgeon: Cristy Bonner DASEN, MD;  Location: [facility];  Service: Orthopedics;  Laterality: Left;   wisdom teeth      Family History  Problem Relation Age of Onset   Osteoporosis Mother    Colon polyps Mother        benign   Heart disease Father    Emphysema Father    COPD Father    Heart attack Father    Lymphoma Father    Diabetes Maternal Grandfather    Colon cancer Neg Hx    Esophageal cancer Neg Hx    Prostate cancer Neg Hx    Rectal cancer Neg Hx    Social History   Socioeconomic History    Marital status: Married    Spouse name: Not on file   Number of children: 1   Years of education: Not on file   Highest education level: Not on file  Occupational History   Occupation: General labor    Employer: [employer]  Tobacco Use   Smoking status: Former    Current packs/day: 0.00    Average packs/day: 3.0 packs/day for 4.0 years (12.0 ttl pk-yrs)    Types: Cigarettes    Start date: 1999    Quit date: 2003    Years since quitting: 22.7   Smokeless tobacco: Current    Types: Chew   Tobacco comments:    1 can a day  Vaping Use   Vaping status: Never Used  Substance and Sexual Activity   Alcohol use: Not Currently   Drug use: Never   Sexual activity: Yes    Partners: Female  Other Topics Concern   Not on file  Social History Narrative   Not on file   Social Drivers of Health   Financial Resource Strain: Low Risk  (09/30/2023)   Overall Financial Resource Strain (CARDIA)    Difficulty of Paying Living Expenses: Not hard at all  Food Insecurity: No Food Insecurity (05/10/2024)   Hunger Vital Sign    Worried About Running Out of Food in the Last Year: Never true    Ran Out of Food in the Last Year: Never true  Transportation Needs: No Transportation Needs (05/10/2024)   PRAPARE - Administrator, Civil Service (Medical): No    Lack of Transportation (Non-Medical): No  Physical Activity: Inactive (09/30/2023)   Exercise Vital Sign    Days of Exercise per Week: 0 days    Minutes of Exercise per Session: 0 min  Stress: No Stress Concern Present (09/30/2023)   [employer] of Occupational Health - Occupational Stress Questionnaire    Feeling of Stress : Not at all  Social Connections: Moderately Integrated (09/30/2023)   Social Connection and Isolation Panel    Frequency of Communication with Friends and Family: More than three times a week    Frequency of Social Gatherings with Friends and Family: More than three times a week    Attends  Religious Services: More than 4 times per year    Active Member of [employer] or Organizations: No    Attends Banker Meetings: Never    Marital Status: Married    Objective:  There were  no vitals taken for this visit.     05/10/2024    8:41 AM 04/12/2024    4:10 PM 09/30/2023    8:02 AM  BP/Weight  Systolic BP 132 154 136  Diastolic BP 72 100 82  Wt. (Lbs) 320 345 342  BMI 44.63 kg/m2 48.12 kg/m2 47.7 kg/m2    Physical Exam Vitals reviewed.  Constitutional:      Appearance: Normal appearance. He is obese.  Neck:     Vascular: No carotid bruit.  Cardiovascular:     Rate and Rhythm: Normal rate and regular rhythm.     Pulses: Normal pulses.     Heart sounds: Normal heart sounds.  Pulmonary:     Effort: Pulmonary effort is normal.     Breath sounds: Normal breath sounds. No wheezing, rhonchi or rales.  Abdominal:     General: Bowel sounds are normal.     Palpations: Abdomen is soft.     Tenderness: There is no abdominal tenderness.  Neurological:     Mental Status: He is alert.  Psychiatric:        Mood and Affect: Mood normal.        Behavior: Behavior normal.     {Perform Simple Foot Exam  Perform Detailed exam:1} {Insert foot Exam (Optional):30965}   Lab Results  Component Value Date   WBC 7.9 09/30/2023   HGB 16.3 09/30/2023   HCT 48.7 09/30/2023   PLT 253 09/30/2023   GLUCOSE 98 09/30/2023   CHOL 153 09/30/2023   TRIG 96 09/30/2023   HDL 30 (L) 09/30/2023   LDLCALC 105 (H) 09/30/2023   ALT 40 09/30/2023   AST 29 09/30/2023   NA 140 09/30/2023   K 4.5 09/30/2023   CL 106 09/30/2023   CREATININE 1.25 09/30/2023   BUN 13 09/30/2023   CO2 21 09/30/2023   TSH 4.410 04/15/2023      Assessment & Plan:  There are no diagnoses linked to this encounter.   There is no height or weight on file to calculate BMI.  Assessment and Plan Assessment & Plan      No orders of the defined types were placed in this encounter.   No orders of  the defined types were placed in this encounter.      Follow-up: No follow-ups on file.  An After Visit Summary was printed and given to the patient.  Abigail Free, MD [facility] [phone]

## 2024-08-21 ENCOUNTER — Encounter: Payer: Self-pay | Admitting: Family Medicine

## 2024-08-21 ENCOUNTER — Ambulatory Visit: Admitting: Family Medicine

## 2024-08-21 VITALS — BP 110/68 | HR 64 | Temp 98.0°F | Resp 16 | Ht 71.0 in | Wt 327.2 lb

## 2024-08-21 DIAGNOSIS — E291 Testicular hypofunction: Secondary | ICD-10-CM | POA: Diagnosis not present

## 2024-08-21 DIAGNOSIS — F3181 Bipolar II disorder: Secondary | ICD-10-CM

## 2024-08-21 DIAGNOSIS — Z6841 Body Mass Index (BMI) 40.0 and over, adult: Secondary | ICD-10-CM | POA: Diagnosis not present

## 2024-08-21 DIAGNOSIS — E66813 Obesity, class 3: Secondary | ICD-10-CM

## 2024-08-21 MED ORDER — BUPROPION HCL ER (XL) 300 MG PO TB24: 300.0000 mg | ORAL_TABLET | Freq: Every day | ORAL | 3 refills | Status: AC

## 2024-08-21 MED ORDER — TESTOSTERONE 50 MG/5GM (1%) TD GEL: 5.0000 g | Freq: Every day | TRANSDERMAL | 5 refills | Status: DC

## 2024-08-21 MED ORDER — SERTRALINE HCL 50 MG PO TABS: 50.0000 mg | ORAL_TABLET | Freq: Every day | ORAL | 3 refills | Status: AC

## 2024-08-21 NOTE — Patient Instructions (Signed)
  VISIT SUMMARY: You had a follow-up visit to discuss your depression, testosterone  therapy, and dietary habits. Your mood has improved with your current medications, and you are more active. We also discussed issues with obtaining your testosterone  injections and potential dietary changes.  YOUR PLAN: DEPRESSIVE DISORDER: Your mood has improved with the combination of Zoloft  and Wellbutrin . -Continue taking Zoloft  and Wellbutrin  as prescribed.  TESTOSTERONE  DEFICIENCY: You are having difficulty obtaining your testosterone  injections, but they are effective when you can take them. -We will order a testosterone  level test. -We will prescribe testosterone  gel, pending insurance approval. - Please check which testosterone  preparations are preferred by your insurance.   UNHEALTHY DIET: Your current diet is high in calories and low in nutrients. -Implement a healthy eating plan. -Please check your insurance coverage for weight loss medications.                      Contains text generated by Abridge.                                 Contains text generated by Abridge.

## 2024-08-25 LAB — TESTOSTERONE,FREE AND TOTAL
Testosterone, Free: 13.1 pg/mL (ref 6.8–21.5)
Testosterone: 903 ng/dL (ref 264–916)

## 2024-08-27 ENCOUNTER — Ambulatory Visit: Payer: Self-pay | Admitting: Family Medicine

## 2024-10-03 ENCOUNTER — Encounter: Payer: 59 | Admitting: Family Medicine

## 2024-10-12 ENCOUNTER — Encounter: Admitting: Family Medicine

## 2024-10-22 ENCOUNTER — Encounter: Payer: Self-pay | Admitting: Family Medicine

## 2024-10-22 ENCOUNTER — Other Ambulatory Visit: Payer: Self-pay | Admitting: Family Medicine

## 2024-10-22 ENCOUNTER — Other Ambulatory Visit: Payer: Self-pay

## 2024-10-22 MED ORDER — ALPRAZOLAM 0.5 MG PO TABS: 0.5000 mg | ORAL_TABLET | Freq: Two times a day (BID) | ORAL | 0 refills | Status: AC | PRN

## 2024-11-14 DIAGNOSIS — M5431 Sciatica, right side: Secondary | ICD-10-CM | POA: Diagnosis not present

## 2024-11-14 DIAGNOSIS — M546 Pain in thoracic spine: Secondary | ICD-10-CM | POA: Diagnosis not present

## 2024-11-17 DIAGNOSIS — M546 Pain in thoracic spine: Secondary | ICD-10-CM | POA: Diagnosis not present

## 2024-11-17 DIAGNOSIS — M5431 Sciatica, right side: Secondary | ICD-10-CM | POA: Diagnosis not present

## 2024-11-28 ENCOUNTER — Ambulatory Visit (INDEPENDENT_AMBULATORY_CARE_PROVIDER_SITE_OTHER): Admitting: Family Medicine

## 2024-11-28 ENCOUNTER — Encounter: Payer: Self-pay | Admitting: Family Medicine

## 2024-11-28 VITALS — BP 138/86 | HR 100 | Temp 98.0°F | Ht 71.0 in | Wt 341.0 lb

## 2024-11-28 DIAGNOSIS — F411 Generalized anxiety disorder: Secondary | ICD-10-CM

## 2024-11-28 DIAGNOSIS — E298 Other testicular dysfunction: Secondary | ICD-10-CM

## 2024-11-28 DIAGNOSIS — F331 Major depressive disorder, recurrent, moderate: Secondary | ICD-10-CM | POA: Diagnosis not present

## 2024-11-28 DIAGNOSIS — Z0001 Encounter for general adult medical examination with abnormal findings: Secondary | ICD-10-CM

## 2024-11-28 NOTE — Progress Notes (Unsigned)
 "  Subjective:  Patient ID: Mitchell Booker, male    DOB: 1981-09-23  Age: 43 y.o. MRN: [id number]  Chief Complaint  Patient presents with   Annual Exam   HPI:  Well Adult Physical: Patient here for a comprehensive physical exam.The patient reports no problems Do you take any herbs or supplements that were not prescribed by a doctor? No Are you taking calcium supplements? no Are you taking aspirin  daily? no  Encounter for general adult medical examination without abnormal findings  Physical (At Risk items are starred): Patient's last physical exam was 1 year ago .  Patient wears a seat belt, has smoke detectors, practices appropriate gun safety, and wears sunscreen with extended sun exposure. Dental Care: Occasional cleanings, brushes daily. Ophthalmology/Optometry: Annual visit.  Hearing loss: none Vision impairments: glasses Discussed the use of AI scribe software for clinical note transcription with the patient, who gave verbal consent to proceed.  History of Present Illness Mitchell Booker is a 43 year old male who presents for an annual physical exam.  General health status - No current health problems - No recent fevers, chills, or sweats - No recent hospitalizations  Musculoskeletal symptoms - No joint or muscle pain beyond typical age-related discomfort - History of left knee surgery for ACL, MCL, and meniscus repair due to work-related injury  Cardiopulmonary symptoms - No chest pain - No shortness of breath  Gastrointestinal symptoms - No abdominal pain - No bowel or bladder issues  Neuropsychiatric symptoms - Mood, depression, and anxiety are stable - No significant depressive or anxious symptoms - Taking Wellbutrin  300 mg daily, sertraline  50 mg daily, and Xanax  0.5 mg twice a day as needed for depression and anxiety, with infrequent use of Xanax   Allergic symptoms - Taking Zyrtec  for allergies  Endocrine therapy - Administers testosterone  injections at  home        08/21/2024    7:56 AM 05/10/2024    9:03 AM 04/12/2024    4:14 PM 09/30/2023    8:09 AM 04/15/2023    3:19 PM  Depression screen PHQ 2/9  Decreased Interest 1 2 1  0 1  Down, Depressed, Hopeless 1 0 1 0 0  PHQ - 2 Score 2 2 2  0 1  Altered sleeping 0 0 0 0 0  Tired, decreased energy 0  1 1 1   Change in appetite 0 0 0 0 0  Feeling bad or failure about yourself  0 0 1 0 0  Trouble concentrating 0 3 0 0 0  Moving slowly or fidgety/restless 0 0 0 0 0  Suicidal thoughts 0 0 0 0 0  PHQ-9 Score 2  5  4  1  2    Difficult doing work/chores Not difficult at all Not difficult at all Not difficult at all Not difficult at all Not difficult at all     Data saved with a previous flowsheet row definition         05/16/2020    3:10 PM 05/22/2020   10:10 AM 04/15/2023    3:19 PM 09/30/2023    8:08 AM 08/21/2024    7:56 AM  Fall Risk  Falls in the past year? 0  0 0 0  Was there an injury with Fall? 0   0  0  0   Fall Risk Category Calculator 0  0 0 0  Fall Risk Category (Retired) Low       (RETIRED) Patient Fall Risk Level Low fall risk  Low fall risk  Patient at Risk for Falls Due to   No Fall Risks No Fall Risks No Fall Risks  Fall risk Follow up Falls evaluation completed   Falls evaluation completed;Falls prevention discussed Falls evaluation completed Falls evaluation completed     Data saved with a previous flowsheet row definition              Past Medical History:  Diagnosis Date   Anxiety    Essential hypertension 02/20/2020   GERD (gastroesophageal reflux disease)    Mixed hyperlipidemia 02/20/2020   Past Surgical History:  Procedure Laterality Date   KNEE ARTHROSCOPY WITH ANTERIOR CRUCIATE LIGAMENT (ACL) REPAIR Left 05/22/2020   Procedure: KNEE ARTHROSCOPY WITH ANTERIOR CRUCIATE LIGAMENT (ACL) REPAIR;  Surgeon: Cristy Bonner DASEN, MD;  Location: [facility];  Service: Orthopedics;  Laterality: Left;   KNEE ARTHROSCOPY WITH MENISCAL REPAIR Left  05/22/2020   Procedure: LEFT KNEE ARTHROSCOPY WITH MEDIAL MENISCAL REPAIR;  Surgeon: Cristy Bonner DASEN, MD;  Location: [facility];  Service: Orthopedics;  Laterality: Left;   wisdom teeth      Family History  Problem Relation Age of Onset   Osteoporosis Mother    Colon polyps Mother        benign   Heart disease Father    Emphysema Father    COPD Father    Heart attack Father    Lymphoma Father    Diabetes Maternal Grandfather    Colon cancer Neg Hx    Esophageal cancer Neg Hx    Prostate cancer Neg Hx    Rectal cancer Neg Hx    Social History   Socioeconomic History   Marital status: Married    Spouse name: Not on file   Number of children: 1   Years of education: Not on file   Highest education level: Not on file  Occupational History   Occupation: General labor    Employer: [employer]  Tobacco Use   Smoking status: Former    Current packs/day: 0.00    Average packs/day: 3.0 packs/day for 4.0 years (12.0 ttl pk-yrs)    Types: Cigarettes    Start date: 1999    Quit date: 2003    Years since quitting: 23.0   Smokeless tobacco: Current    Types: Snuff   Tobacco comments:    1 can a day  Vaping Use   Vaping status: Never Used  Substance and Sexual Activity   Alcohol use: Not Currently   Drug use: Never   Sexual activity: Yes    Partners: Female  Other Topics Concern   Not on file  Social History Narrative   Not on file   Social Drivers of Health   Tobacco Use: High Risk (11/28/2024)   Patient History    Smoking Tobacco Use: Former    Smokeless Tobacco Use: Current    Passive Exposure: Not on Actuary Strain: Low Risk (09/30/2023)   Overall Financial Resource Strain (CARDIA)    Difficulty of Paying Living Expenses: Not hard at all  Food Insecurity: No Food Insecurity (05/10/2024)   Hunger Vital Sign    Worried About Running Out of Food in the Last Year: Never true    Ran Out of Food in the Last Year: Never true   Transportation Needs: No Transportation Needs (05/10/2024)   PRAPARE - Administrator, Civil Service (Medical): No    Lack of Transportation (Non-Medical): No  Physical Activity: Inactive (09/30/2023)   Exercise  Vital Sign    Days of Exercise per Week: 0 days    Minutes of Exercise per Session: 0 min  Stress: No Stress Concern Present (09/30/2023)   [employer] of Occupational Health - Occupational Stress Questionnaire    Feeling of Stress : Not at all  Social Connections: Moderately Integrated (09/30/2023)   Social Connection and Isolation Panel    Frequency of Communication with Friends and Family: More than three times a week    Frequency of Social Gatherings with Friends and Family: More than three times a week    Attends Religious Services: More than 4 times per year    Active Member of Clubs or Organizations: No    Attends Banker Meetings: Never    Marital Status: Married  Depression (PHQ2-9): Low Risk (08/21/2024)   Depression (PHQ2-9)    PHQ-2 Score: 2  Alcohol Screen: Low Risk (05/10/2024)   Alcohol Screen    Last Alcohol Screening Score (AUDIT): 0  Housing: Low Risk (05/10/2024)   Housing Stability Vital Sign    Unable to Pay for Housing in the Last Year: No    Number of Times Moved in the Last Year: 0    Homeless in the Last Year: No  Utilities: Not At Risk (05/10/2024)   AHC Utilities    Threatened with loss of utilities: No  Health Literacy: Adequate Health Literacy (05/10/2024)   B1300 Health Literacy    Frequency of need for help with medical instructions: Never   Review of Systems  Constitutional:  Negative for chills, fatigue and fever.  HENT:  Negative for congestion, ear pain and sore throat.   Respiratory:  Negative for cough and shortness of breath.   Cardiovascular:  Negative for chest pain.  Gastrointestinal:  Negative for abdominal pain, constipation, diarrhea, nausea and vomiting.  Endocrine: Negative for polydipsia,  polyphagia and polyuria.  Genitourinary:  Negative for dysuria and frequency.  Musculoskeletal:  Negative for arthralgias and myalgias.  Neurological:  Negative for dizziness and headaches.  Psychiatric/Behavioral:  Negative for dysphoric mood.        No dysphoria     Objective:  BP 138/86   Pulse 100   Temp 98 F (36.7 C)   Ht 5' 11 (1.803 m)   Wt (!) 341 lb (154.7 kg)   SpO2 95%   BMI 47.56 kg/m      11/28/2024    3:09 PM 08/21/2024    7:54 AM 05/10/2024    8:41 AM  BP/Weight  Systolic BP 138 110 132  Diastolic BP 86 68 72  Wt. (Lbs) 341 327.2 320  BMI 47.56 kg/m2 45.64 kg/m2 44.63 kg/m2    Physical Exam Vitals reviewed.  Constitutional:      Appearance: Normal appearance. He is obese.  HENT:     Right Ear: Tympanic membrane, ear canal and external ear normal.     Left Ear: Tympanic membrane, ear canal and external ear normal.     Nose: Nose normal. No congestion or rhinorrhea.     Mouth/Throat:     Mouth: Mucous membranes are moist.     Pharynx: No oropharyngeal exudate or posterior oropharyngeal erythema.  Neck:     Vascular: No carotid bruit.  Cardiovascular:     Rate and Rhythm: Normal rate and regular rhythm.     Pulses: Normal pulses.     Heart sounds: Normal heart sounds.  Pulmonary:     Effort: Pulmonary effort is normal. No respiratory distress.  Breath sounds: Normal breath sounds. No wheezing, rhonchi or rales.  Abdominal:     General: Bowel sounds are normal.     Palpations: Abdomen is soft.     Tenderness: There is no abdominal tenderness.  Lymphadenopathy:     Cervical: No cervical adenopathy.  Neurological:     Mental Status: He is alert and oriented to person, place, and time.  Psychiatric:        Mood and Affect: Mood normal.        Behavior: Behavior normal.     Lab Results  Component Value Date   WBC 9.7 11/28/2024   HGB 16.1 11/28/2024   HCT 47.4 11/28/2024   PLT 277 11/28/2024   GLUCOSE 88 11/28/2024   CHOL 173  11/28/2024   TRIG 155 (H) 11/28/2024   HDL 36 (L) 11/28/2024   LDLCALC 109 (H) 11/28/2024   ALT 28 11/28/2024   AST 27 11/28/2024   NA 140 11/28/2024   K 4.3 11/28/2024   CL 103 11/28/2024   CREATININE 1.52 (H) 11/28/2024   BUN 13 11/28/2024   CO2 20 11/28/2024   TSH 4.270 11/28/2024      Assessment & Plan:   Assessment & Plan Encounter for routine adult medical exam with abnormal findings Lacks daily flossing and healthy eating habits. - Recommended dental appointments twice a year. - Encouraged daily flossing. - Discussed importance of regular exercise and healthy eating. - Recommended consulting a dietitian. - Discussed benefits of weight loss. - Recommended flu shot and hepatitis B vaccine series. Orders:   CBC with Differential/Platelet   Comprehensive metabolic panel with GFR   Lipid panel   TSH  Depression, major, recurrent, moderate (HCC) He reports improvement but acknowledges daily challenges. - Continue Wellbutrin  300 mg daily. - Continue sertraline  50 mg daily. - Continue Xanax  0.5 mg twice daily as needed.    GAD (generalized anxiety disorder) He reports improvement but acknowledges daily challenges. - Continue Wellbutrin  300 mg daily. - Continue sertraline  50 mg daily. - Continue Xanax  0.5 mg twice daily as needed.    Other testicular dysfunction Managed with testosterone  injections. - Continue testosterone  injections as prescribed.      Body mass index is 47.56 kg/m.      This is a list of the screening recommended for you and due dates:  Health Maintenance  Topic Date Due   Hepatitis B Vaccine (1 of 3 - 19+ 3-dose series) Never done   HPV Vaccine (1 - 3-dose SCDM series) Never done   Flu Shot  02/27/2025*   Pneumococcal Vaccine (1 of 2 - PCV) 05/10/2025*   DTaP/Tdap/Td vaccine (2 - Td or Tdap) 11/23/2026   Hepatitis C Screening  Completed   HIV Screening  Completed   Meningitis B Vaccine  Aged Out   COVID-19 Vaccine  Discontinued   *Topic was postponed. The date shown is not the original due date.     No orders of the defined types were placed in this encounter.   I,Marla I Leal-Borjas,acting as a scribe for Abigail Free, MD.,have documented all relevant documentation on the behalf of Abigail Free, MD,as directed by  Abigail Free, MD while in the presence of Abigail Free, MD.   Follow-up: No follow-ups on file.  An After Visit Summary was printed and given to the patient.  I attest that I have reviewed this visit and agree with the plan scribed by my staff.   Abigail Free, MD [facility] [phone]     "

## 2024-11-28 NOTE — Patient Instructions (Signed)
" °  VISIT SUMMARY: Today, you had your annual physical exam. We discussed your overall health, including your mental health, testosterone  therapy, and nicotine use. We also talked about some general health maintenance tips.  YOUR PLAN: MAJOR DEPRESSIVE DISORDER AND GENERALIZED ANXIETY DISORDER: You are managing your depression and anxiety with medication and report some improvement. -Continue taking Wellbutrin  300 mg daily. -Continue taking sertraline  50 mg daily. -Continue taking Xanax  0.5 mg twice daily as needed.  TESTICULAR HYPOFUNCTION: You are managing this condition with testosterone  injections. -Continue testosterone  injections as prescribed.  NICOTINE DEPENDENCE, CHEWING TOBACCO: You have chronic nicotine dependence and use dip daily. -Encouraged regular oral self-examinations for oral cancer.  GENERAL HEALTH MAINTENANCE: We discussed some general health tips to improve your overall well-being. -Recommended dental appointments twice a year. -Encouraged daily flossing. -Discussed the importance of regular exercise and healthy eating. -Recommended consulting a dietitian. -Discussed the benefits of weight loss. -Recommended getting a flu shot and the hepatitis B vaccine series.                      Contains text generated by Abridge.                                 Contains text generated by Abridge.   "

## 2024-11-29 ENCOUNTER — Ambulatory Visit: Payer: Self-pay | Admitting: Family Medicine

## 2024-11-29 DIAGNOSIS — Z0001 Encounter for general adult medical examination with abnormal findings: Secondary | ICD-10-CM | POA: Insufficient documentation

## 2024-11-29 LAB — CBC WITH DIFFERENTIAL/PLATELET
Basophils Absolute: 0.1 x10E3/uL (ref 0.0–0.2)
Basos: 1 %
EOS (ABSOLUTE): 0.1 x10E3/uL (ref 0.0–0.4)
Eos: 1 %
Hematocrit: 47.4 % (ref 37.5–51.0)
Hemoglobin: 16.1 g/dL (ref 13.0–17.7)
Immature Grans (Abs): 0 x10E3/uL (ref 0.0–0.1)
Immature Granulocytes: 0 %
Lymphocytes Absolute: 2.9 x10E3/uL (ref 0.7–3.1)
Lymphs: 30 %
MCH: 29.5 pg (ref 26.6–33.0)
MCHC: 34 g/dL (ref 31.5–35.7)
MCV: 87 fL (ref 79–97)
Monocytes Absolute: 0.7 x10E3/uL (ref 0.1–0.9)
Monocytes: 8 %
Neutrophils Absolute: 5.8 x10E3/uL (ref 1.4–7.0)
Neutrophils: 60 %
Platelets: 277 x10E3/uL (ref 150–450)
RBC: 5.45 x10E6/uL (ref 4.14–5.80)
RDW: 13.2 % (ref 11.6–15.4)
WBC: 9.7 x10E3/uL (ref 3.4–10.8)

## 2024-11-29 LAB — COMPREHENSIVE METABOLIC PANEL WITH GFR
ALT: 28 IU/L (ref 0–44)
AST: 27 IU/L (ref 0–40)
Albumin: 4.2 g/dL (ref 4.1–5.1)
Alkaline Phosphatase: 94 IU/L (ref 47–123)
BUN/Creatinine Ratio: 9 (ref 9–20)
BUN: 13 mg/dL (ref 6–24)
Bilirubin Total: 0.4 mg/dL (ref 0.0–1.2)
CO2: 20 mmol/L (ref 20–29)
Calcium: 9.5 mg/dL (ref 8.7–10.2)
Chloride: 103 mmol/L (ref 96–106)
Creatinine, Ser: 1.52 mg/dL — ABNORMAL HIGH (ref 0.76–1.27)
Globulin, Total: 2.7 g/dL (ref 1.5–4.5)
Glucose: 88 mg/dL (ref 70–99)
Potassium: 4.3 mmol/L (ref 3.5–5.2)
Sodium: 140 mmol/L (ref 134–144)
Total Protein: 6.9 g/dL (ref 6.0–8.5)
eGFR: 59 mL/min/1.73 — ABNORMAL LOW

## 2024-11-29 LAB — LIPID PANEL
Chol/HDL Ratio: 4.8 ratio (ref 0.0–5.0)
Cholesterol, Total: 173 mg/dL (ref 100–199)
HDL: 36 mg/dL — ABNORMAL LOW
LDL Chol Calc (NIH): 109 mg/dL — ABNORMAL HIGH (ref 0–99)
Triglycerides: 155 mg/dL — ABNORMAL HIGH (ref 0–149)
VLDL Cholesterol Cal: 28 mg/dL (ref 5–40)

## 2024-11-29 LAB — TSH: TSH: 4.27 u[IU]/mL (ref 0.450–4.500)

## 2024-11-29 NOTE — Assessment & Plan Note (Addendum)
 He reports improvement but acknowledges daily challenges. - Continue Wellbutrin  300 mg daily. - Continue sertraline  50 mg daily. - Continue Xanax  0.5 mg twice daily as needed.

## 2024-11-29 NOTE — Assessment & Plan Note (Addendum)
 Lacks daily flossing and healthy eating habits. - Recommended dental appointments twice a year. - Encouraged daily flossing. - Discussed importance of regular exercise and healthy eating. - Recommended consulting a dietitian. - Discussed benefits of weight loss. - Recommended flu shot and hepatitis B vaccine series. Orders:   CBC with Differential/Platelet   Comprehensive metabolic panel with GFR   Lipid panel   TSH

## 2024-11-29 NOTE — Assessment & Plan Note (Addendum)
 Managed with testosterone  injections. - Continue testosterone  injections as prescribed.

## 2025-02-19 ENCOUNTER — Ambulatory Visit: Admitting: Family Medicine
